# Patient Record
Sex: Male | Born: 1960
Health system: Southern US, Community
[De-identification: ages and names within clinical notes are randomized; demographics above are authoritative.]

## PROBLEM LIST (undated history)

## (undated) DIAGNOSIS — T148XXA Other injury of unspecified body region, initial encounter: Secondary | ICD-10-CM

## (undated) DIAGNOSIS — S31109A Unspecified open wound of abdominal wall, unspecified quadrant without penetration into peritoneal cavity, initial encounter: Secondary | ICD-10-CM

## (undated) DIAGNOSIS — K9289 Other specified diseases of the digestive system: Secondary | ICD-10-CM

## (undated) DIAGNOSIS — K5792 Diverticulitis of intestine, part unspecified, without perforation or abscess without bleeding: Secondary | ICD-10-CM

## (undated) HISTORY — PX: KNEE SURGERY: SHX244

## (undated) HISTORY — DX: Other injury of unspecified body region, initial encounter: T14.8XXA

## (undated) HISTORY — DX: Other specified diseases of the digestive system: K92.89

## (undated) HISTORY — PX: NASAL SINUS SURGERY: SHX719

## (undated) HISTORY — PX: TOE SURGERY: SHX1073

## (undated) HISTORY — PX: ABDOMINAL SURGERY: SHX537

## (undated) HISTORY — DX: Unspecified open wound of abdominal wall, unspecified quadrant without penetration into peritoneal cavity, initial encounter: S31.109A

---

## 2003-07-02 ENCOUNTER — Inpatient Hospital Stay (HOSPITAL_COMMUNITY): Admission: EM | Admit: 2003-07-02 | Discharge: 2003-07-07 | Payer: Self-pay | Admitting: Emergency Medicine

## 2003-12-10 ENCOUNTER — Encounter: Admission: RE | Admit: 2003-12-10 | Discharge: 2003-12-10 | Payer: Self-pay | Admitting: Family Medicine

## 2004-01-11 ENCOUNTER — Ambulatory Visit (HOSPITAL_COMMUNITY): Admission: RE | Admit: 2004-01-11 | Discharge: 2004-01-11 | Payer: Self-pay | Admitting: *Deleted

## 2004-01-11 ENCOUNTER — Encounter (INDEPENDENT_AMBULATORY_CARE_PROVIDER_SITE_OTHER): Payer: Self-pay | Admitting: *Deleted

## 2010-03-31 ENCOUNTER — Encounter
Admission: RE | Admit: 2010-03-31 | Discharge: 2010-03-31 | Payer: Self-pay | Source: Home / Self Care | Attending: Gastroenterology | Admitting: Gastroenterology

## 2010-05-06 ENCOUNTER — Other Ambulatory Visit (HOSPITAL_COMMUNITY): Payer: Self-pay | Admitting: General Surgery

## 2010-05-06 ENCOUNTER — Encounter (HOSPITAL_COMMUNITY)
Admission: RE | Admit: 2010-05-06 | Discharge: 2010-05-06 | Disposition: A | Discharge status: Home / Self Care | Payer: BC Managed Care – PPO | Source: Ambulatory Visit | Attending: General Surgery | Admitting: General Surgery

## 2010-05-06 ENCOUNTER — Ambulatory Visit (HOSPITAL_COMMUNITY)
Admission: RE | Admit: 2010-05-06 | Discharge: 2010-05-06 | Disposition: A | Discharge status: Home / Self Care | Payer: BC Managed Care – PPO | Source: Ambulatory Visit | Attending: General Surgery | Admitting: General Surgery

## 2010-05-06 DIAGNOSIS — K5732 Diverticulitis of large intestine without perforation or abscess without bleeding: Secondary | ICD-10-CM | POA: Insufficient documentation

## 2010-05-06 DIAGNOSIS — Z01818 Encounter for other preprocedural examination: Secondary | ICD-10-CM | POA: Insufficient documentation

## 2010-05-06 DIAGNOSIS — Z01812 Encounter for preprocedural laboratory examination: Secondary | ICD-10-CM | POA: Insufficient documentation

## 2010-05-06 DIAGNOSIS — Z01810 Encounter for preprocedural cardiovascular examination: Secondary | ICD-10-CM | POA: Insufficient documentation

## 2010-05-06 LAB — CBC
HCT: 40.5 % (ref 39.0–52.0)
Hemoglobin: 13.8 g/dL (ref 13.0–17.0)
MCHC: 34.1 g/dL (ref 30.0–36.0)
MCV: 89.2 fL (ref 78.0–100.0)
Platelets: 248 10*3/uL (ref 150–400)
RBC: 4.54 MIL/uL (ref 4.22–5.81)
RDW: 13.6 % (ref 11.5–15.5)
WBC: 6.4 10*3/uL (ref 4.0–10.5)

## 2010-05-06 LAB — BASIC METABOLIC PANEL
BUN: 13 mg/dL (ref 6–23)
CO2: 29 mEq/L (ref 19–32)
Calcium: 9.3 mg/dL (ref 8.4–10.5)
Chloride: 105 mEq/L (ref 96–112)
Creatinine, Ser: 0.86 mg/dL (ref 0.4–1.5)
GFR calc Af Amer: >60 mL/min (ref >60)
GFR calc non Af Amer: >60 mL/min (ref >60)
Glucose, Bld: 93 mg/dL (ref 70–99)
Potassium: 4.9 mEq/L (ref 3.5–5.1)
Sodium: 138 mEq/L (ref 135–145)

## 2010-05-06 LAB — DIFFERENTIAL
Basophils Absolute: 0 10*3/uL (ref 0.0–0.1)
Eosinophils Absolute: 0.5 10*3/uL (ref 0.0–0.7)
Eosinophils Relative: 7 % — ABNORMAL HIGH (ref 0–5)
Lymphocytes Relative: 35 % (ref 12–46)
Lymphs Abs: 2.2 10*3/uL (ref 0.7–4.0)
Monocytes Relative: 6 % (ref 3–12)
Neutro Abs: 3.3 10*3/uL (ref 1.7–7.7)

## 2010-05-06 LAB — SURGICAL PCR SCREEN
MRSA, PCR: NEGATIVE
Staphylococcus aureus: NEGATIVE

## 2010-05-08 ENCOUNTER — Other Ambulatory Visit: Payer: Self-pay | Admitting: General Surgery

## 2010-05-08 ENCOUNTER — Inpatient Hospital Stay (HOSPITAL_COMMUNITY)
Admission: RE | Admit: 2010-05-08 | Discharge: 2010-05-23 | DRG: 148 | Disposition: A | Discharge status: Home / Self Care | Payer: BC Managed Care – PPO | Source: Ambulatory Visit | Attending: General Surgery | Admitting: General Surgery

## 2010-05-08 DIAGNOSIS — K5732 Diverticulitis of large intestine without perforation or abscess without bleeding: Principal | ICD-10-CM | POA: Diagnosis present

## 2010-05-08 DIAGNOSIS — K661 Hemoperitoneum: Secondary | ICD-10-CM | POA: Diagnosis present

## 2010-05-08 DIAGNOSIS — R000 Tachycardia, unspecified: Secondary | ICD-10-CM | POA: Diagnosis present

## 2010-05-08 DIAGNOSIS — D62 Acute posthemorrhagic anemia: Secondary | ICD-10-CM | POA: Diagnosis not present

## 2010-05-08 DIAGNOSIS — K929 Disease of digestive system, unspecified: Secondary | ICD-10-CM | POA: Diagnosis present

## 2010-05-08 DIAGNOSIS — Y849 Medical procedure, unspecified as the cause of abnormal reaction of the patient, or of later complication, without mention of misadventure at the time of the procedure: Secondary | ICD-10-CM | POA: Diagnosis not present

## 2010-05-08 DIAGNOSIS — K560 Paralytic ileus: Secondary | ICD-10-CM | POA: Diagnosis not present

## 2010-05-08 LAB — ABO/RH: ABO/RH(D): A POS

## 2010-05-08 LAB — TYPE AND SCREEN
ABO/RH(D): A POS
Antibody Screen: NEGATIVE

## 2010-05-09 LAB — BASIC METABOLIC PANEL
BUN: 6 mg/dL (ref 6–23)
CO2: 28 mEq/L (ref 19–32)
Calcium: 8.9 mg/dL (ref 8.4–10.5)
Chloride: 102 mEq/L (ref 96–112)
Creatinine, Ser: 0.88 mg/dL (ref 0.4–1.5)
GFR calc Af Amer: >60 mL/min (ref >60)
GFR calc non Af Amer: >60 mL/min (ref >60)
Glucose, Bld: 126 mg/dL — ABNORMAL HIGH (ref 70–99)
Potassium: 4.3 mEq/L (ref 3.5–5.1)
Sodium: 136 mEq/L (ref 135–145)

## 2010-05-09 LAB — DIFFERENTIAL
Basophils Absolute: 0 10*3/uL (ref 0.0–0.1)
Basophils Relative: 0 % (ref 0–1)
Eosinophils Absolute: 0 10*3/uL (ref 0.0–0.7)
Eosinophils Relative: 0 % (ref 0–5)
Lymphocytes Relative: 13 % (ref 12–46)
Lymphs Abs: 1.7 10*3/uL (ref 0.7–4.0)
Monocytes Absolute: 0.8 10*3/uL (ref 0.1–1.0)
Monocytes Relative: 6 % (ref 3–12)
Neutro Abs: 10.5 10*3/uL — ABNORMAL HIGH (ref 1.7–7.7)
Neutrophils Relative %: 80 % — ABNORMAL HIGH (ref 43–77)

## 2010-05-09 LAB — CBC
HCT: 37.8 % — ABNORMAL LOW (ref 39.0–52.0)
Hemoglobin: 13.3 g/dL (ref 13.0–17.0)
MCH: 30.5 pg (ref 26.0–34.0)
MCHC: 35.2 g/dL (ref 30.0–36.0)
MCV: 86.7 fL (ref 78.0–100.0)
Platelets: 248 10*3/uL (ref 150–400)
RBC: 4.36 MIL/uL (ref 4.22–5.81)
RDW: 13 % (ref 11.5–15.5)
WBC: 13.1 10*3/uL — ABNORMAL HIGH (ref 4.0–10.5)

## 2010-05-10 LAB — HEMOGLOBIN AND HEMATOCRIT, BLOOD
HCT: 34.1 % — ABNORMAL LOW (ref 39.0–52.0)
Hemoglobin: 12.2 g/dL — ABNORMAL LOW (ref 13.0–17.0)

## 2010-05-11 LAB — DIFFERENTIAL
Basophils Absolute: 0 10*3/uL (ref 0.0–0.1)
Basophils Relative: 0 % (ref 0–1)
Eosinophils Absolute: 0.1 10*3/uL (ref 0.0–0.7)
Eosinophils Relative: 1 % (ref 0–5)
Lymphocytes Relative: 21 % (ref 12–46)
Lymphs Abs: 2.1 10*3/uL (ref 0.7–4.0)
Monocytes Absolute: 1 10*3/uL (ref 0.1–1.0)
Monocytes Relative: 9 % (ref 3–12)
Neutro Abs: 7 10*3/uL (ref 1.7–7.7)
Neutrophils Relative %: 68 % (ref 43–77)

## 2010-05-11 LAB — CBC
HCT: 22 % — ABNORMAL LOW (ref 39.0–52.0)
Hemoglobin: 8 g/dL — ABNORMAL LOW (ref 13.0–17.0)
MCH: 31.9 pg (ref 26.0–34.0)
MCHC: 36.4 g/dL — ABNORMAL HIGH (ref 30.0–36.0)
MCV: 87.6 fL (ref 78.0–100.0)
Platelets: 171 10*3/uL (ref 150–400)
RBC: 2.51 MIL/uL — ABNORMAL LOW (ref 4.22–5.81)
RDW: 13 % (ref 11.5–15.5)
WBC: 10.2 10*3/uL (ref 4.0–10.5)

## 2010-05-11 LAB — BASIC METABOLIC PANEL
BUN: 7 mg/dL (ref 6–23)
CO2: 28 mEq/L (ref 19–32)
Calcium: 8.6 mg/dL (ref 8.4–10.5)
Chloride: 102 mEq/L (ref 96–112)
Creatinine, Ser: 0.89 mg/dL (ref 0.4–1.5)
GFR calc Af Amer: >60 mL/min (ref >60)
GFR calc non Af Amer: >60 mL/min (ref >60)
Glucose, Bld: 118 mg/dL — ABNORMAL HIGH (ref 70–99)
Potassium: 4.4 mEq/L (ref 3.5–5.1)
Sodium: 135 mEq/L (ref 135–145)

## 2010-05-12 LAB — CBC
HCT: 21 % — ABNORMAL LOW (ref 39.0–52.0)
Hemoglobin: 7.5 g/dL — ABNORMAL LOW (ref 13.0–17.0)
MCH: 31.4 pg (ref 26.0–34.0)
MCHC: 35.7 g/dL (ref 30.0–36.0)
MCV: 87.9 fL (ref 78.0–100.0)
Platelets: 193 10*3/uL (ref 150–400)
RBC: 2.39 MIL/uL — ABNORMAL LOW (ref 4.22–5.81)
RDW: 12.9 % (ref 11.5–15.5)
WBC: 10.1 10*3/uL (ref 4.0–10.5)

## 2010-05-13 LAB — CBC
HCT: 20.8 % — ABNORMAL LOW (ref 39.0–52.0)
Hemoglobin: 7.4 g/dL — ABNORMAL LOW (ref 13.0–17.0)
MCH: 31.2 pg (ref 26.0–34.0)
MCHC: 35.6 g/dL (ref 30.0–36.0)
MCV: 87.8 fL (ref 78.0–100.0)
Platelets: 256 10*3/uL (ref 150–400)
RBC: 2.37 MIL/uL — ABNORMAL LOW (ref 4.22–5.81)
RDW: 12.8 % (ref 11.5–15.5)
WBC: 8.5 10*3/uL (ref 4.0–10.5)

## 2010-05-13 LAB — DIFFERENTIAL
Basophils Absolute: 0 10*3/uL (ref 0.0–0.1)
Basophils Relative: 0 % (ref 0–1)
Eosinophils Absolute: 0.2 10*3/uL (ref 0.0–0.7)
Eosinophils Relative: 3 % (ref 0–5)
Lymphocytes Relative: 15 % (ref 12–46)
Lymphs Abs: 1.3 10*3/uL (ref 0.7–4.0)
Monocytes Absolute: 0.9 10*3/uL (ref 0.1–1.0)
Monocytes Relative: 10 % (ref 3–12)
Neutro Abs: 6 10*3/uL (ref 1.7–7.7)
Neutrophils Relative %: 72 % (ref 43–77)

## 2010-05-14 ENCOUNTER — Inpatient Hospital Stay (HOSPITAL_COMMUNITY): Payer: BC Managed Care – PPO

## 2010-05-14 LAB — CBC
Hemoglobin: 7.9 g/dL — ABNORMAL LOW (ref 13.0–17.0)
MCH: 31.6 pg (ref 26.0–34.0)
MCHC: 36.1 g/dL — ABNORMAL HIGH (ref 30.0–36.0)
Platelets: 349 10*3/uL (ref 150–400)

## 2010-05-14 LAB — URINALYSIS, MICROSCOPIC ONLY
Bilirubin Urine: NEGATIVE
Ketones, ur: 15 mg/dL — AB
Protein, ur: 100 mg/dL — AB
Urobilinogen, UA: 0.2 mg/dL (ref 0.0–1.0)

## 2010-05-14 LAB — POCT I-STAT 4, (NA,K, GLUC, HGB,HCT)
Glucose, Bld: 125 mg/dL — ABNORMAL HIGH (ref 70–99)
HCT: 27 % — ABNORMAL LOW (ref 39.0–52.0)
Sodium: 136 mEq/L (ref 135–145)

## 2010-05-15 LAB — DIFFERENTIAL
Basophils Relative: 0 % (ref 0–1)
Eosinophils Absolute: 0 10*3/uL (ref 0.0–0.7)
Eosinophils Relative: 0 % (ref 0–5)
Monocytes Relative: 7 % (ref 3–12)
Neutrophils Relative %: 87 % — ABNORMAL HIGH (ref 43–77)

## 2010-05-15 LAB — CBC
MCH: 30.5 pg (ref 26.0–34.0)
Platelets: 398 10*3/uL (ref 150–400)
RBC: 2.72 MIL/uL — ABNORMAL LOW (ref 4.22–5.81)
RDW: 13.2 % (ref 11.5–15.5)

## 2010-05-15 LAB — BASIC METABOLIC PANEL
Calcium: 8.2 mg/dL — ABNORMAL LOW (ref 8.4–10.5)
Chloride: 100 mEq/L (ref 96–112)
Creatinine, Ser: 0.79 mg/dL (ref 0.4–1.5)
GFR calc Af Amer: >60 mL/min (ref >60)
GFR calc non Af Amer: >60 mL/min (ref >60)

## 2010-05-15 NOTE — Op Note (Signed)
NAME:  Michael Santiago, Michael Santiago             ACCOUNT NO.:  [id number]  MEDICAL RECORD NO.:  [id number]           PATIENT TYPE:  I  LOCATION:  5155                         FACILITY:  [facility]  PHYSICIAN:  Cherylynn Ridges, M.D.    DATE OF BIRTH:  November 16, 1960  DATE OF PROCEDURE:  05/08/2010 DATE OF DISCHARGE:                              OPERATIVE REPORT   PREOPERATIVE DIAGNOSIS:  Recurrent sigmoid diverticulitis.  POSTOPERATIVE DIAGNOSIS:  Recurrent sigmoid diverticulitis.  PROCEDURE:  Sigmoid colectomy.  SURGEON:  Marta Lamas. Lindie Spruce, MD  ASSISTANT:  Almond Lint, MD  ANESTHESIA:  General endotracheal.  ESTIMATED BLOOD LOSS:  Less than 50 mL.  COMPLICATIONS:  None.  CONDITION:  Stable.  FINDINGS:  Midsigmoid recurrent chronic inflammation from acute diverticulitis.  INDICATIONS FOR OPERATION:  The patient is a 50 year old who has had multiple attacks of diverticulitis who now comes in for an elective colectomy.  OPERATION:  The patient was taken to the operating room and placed on table in supine position.  After an adequate general endotracheal anesthetic was administered, he was prepped and draped in the usual sterile manner exposing the midline of the abdomen.  After proper time-out was performed identifying the patient and procedure to be performed, a midline incision was made from below the umbilicus down to the pubic crest.  We took it down to and through the midline fascia and then through the peritoneum taking care not to endanger bowel below.  A Balfour retractor was put in place.  The patient was placed in Trendelenburg position.  We could easily visualize and mobilize the mid sigmoid colon inflammatory mass that was sort of a ball of tissue.  We mobilized it and with proper retraction went through the mesentery below the level of the sigmoid mass.  We came across the upper portion of the colon using a GIA 75 stapler.  We mobilized the colon proximally, making sure to keep  down the ureter and other structures laterally which we did so.  We mobilized the line of Toldt.  We could easily see the left ureter and was out of the area of resection.  We came across more proximal descending colon just proximal to the sigmoid colon with a GIA 75 stapler.  We used an [employer] Jaw coagulator to come across the mesentery.  This freed up as specimen which was sent separately.  Once this was done, we did a hand-sewn single layer anastomosis using 2-0 silk between the proximal descending colon and the distal sigmoid colon. There was no tension on the anastomosis whatsoever.  A circumferential anastomosis was performed.  Once this was done, we changed surgeon's gloves, irrigated with saline solution, and closed.  We closed using a running looped #1 PDS suture.  The skin was closed using stainless steel staples.  We irrigated the subcu prior to closure. A sterile dressing was applied.  All needle counts, sponge counts, and instrument counts were correct.     Cherylynn Ridges, M.D.     JOW/MEDQ  D:  05/08/2010  T:  05/09/2010  Job:  [id number]  cc:   Anselmo Rod, MD, [facility]  Electronically Signed by Jimmye Norman M.D. on 05/14/2010 02:31:04 PM

## 2010-05-16 LAB — DIFFERENTIAL
Basophils Absolute: 0 10*3/uL (ref 0.0–0.1)
Basophils Relative: 0 % (ref 0–1)
Eosinophils Absolute: 0 10*3/uL (ref 0.0–0.7)
Eosinophils Relative: 0 % (ref 0–5)
Lymphocytes Relative: 7 % — ABNORMAL LOW (ref 12–46)
Lymphs Abs: 1.1 10*3/uL (ref 0.7–4.0)
Monocytes Absolute: 1.4 10*3/uL — ABNORMAL HIGH (ref 0.1–1.0)
Monocytes Relative: 9 % (ref 3–12)
Neutro Abs: 12.7 10*3/uL — ABNORMAL HIGH (ref 1.7–7.7)
Neutrophils Relative %: 83 % — ABNORMAL HIGH (ref 43–77)

## 2010-05-16 LAB — BASIC METABOLIC PANEL WITH GFR
BUN: 11 mg/dL (ref 6–23)
CO2: 26 meq/L (ref 19–32)
Calcium: 7.9 mg/dL — ABNORMAL LOW (ref 8.4–10.5)
Chloride: 103 meq/L (ref 96–112)
Creatinine, Ser: 0.79 mg/dL (ref 0.4–1.5)
GFR calc non Af Amer: >60 mL/min
Glucose, Bld: 132 mg/dL — ABNORMAL HIGH (ref 70–99)
Potassium: 4.1 meq/L (ref 3.5–5.1)
Sodium: 134 meq/L — ABNORMAL LOW (ref 135–145)

## 2010-05-16 LAB — CBC
Hemoglobin: 7.5 g/dL — ABNORMAL LOW (ref 13.0–17.0)
Platelets: 425 10*3/uL — ABNORMAL HIGH (ref 150–400)
RBC: 2.48 MIL/uL — ABNORMAL LOW (ref 4.22–5.81)
WBC: 15.2 10*3/uL — ABNORMAL HIGH (ref 4.0–10.5)

## 2010-05-18 ENCOUNTER — Inpatient Hospital Stay (HOSPITAL_COMMUNITY): Payer: BC Managed Care – PPO

## 2010-05-18 LAB — BASIC METABOLIC PANEL
CO2: 29 mEq/L (ref 19–32)
Calcium: 8.2 mg/dL — ABNORMAL LOW (ref 8.4–10.5)
Chloride: 105 mEq/L (ref 96–112)
GFR calc Af Amer: >60 mL/min (ref >60)
Glucose, Bld: 139 mg/dL — ABNORMAL HIGH (ref 70–99)
Sodium: 140 mEq/L (ref 135–145)

## 2010-05-18 LAB — CBC
HCT: 20.5 % — ABNORMAL LOW (ref 39.0–52.0)
Hemoglobin: 7.1 g/dL — ABNORMAL LOW (ref 13.0–17.0)
MCH: 30.3 pg (ref 26.0–34.0)
MCHC: 34.6 g/dL (ref 30.0–36.0)
RBC: 2.34 MIL/uL — ABNORMAL LOW (ref 4.22–5.81)

## 2010-05-19 LAB — CBC
Hemoglobin: 7.1 g/dL — ABNORMAL LOW (ref 13.0–17.0)
MCH: 30.5 pg (ref 26.0–34.0)
MCHC: 34.5 g/dL (ref 30.0–36.0)
MCV: 88.4 fL (ref 78.0–100.0)
RBC: 2.33 MIL/uL — ABNORMAL LOW (ref 4.22–5.81)

## 2010-05-19 LAB — BASIC METABOLIC PANEL
BUN: 9 mg/dL (ref 6–23)
CO2: 29 mEq/L (ref 19–32)
Calcium: 8.3 mg/dL — ABNORMAL LOW (ref 8.4–10.5)
Chloride: 107 mEq/L (ref 96–112)
Creatinine, Ser: 0.75 mg/dL (ref 0.4–1.5)
GFR calc Af Amer: >60 mL/min (ref >60)
Glucose, Bld: 111 mg/dL — ABNORMAL HIGH (ref 70–99)

## 2010-05-21 LAB — CULTURE, BLOOD (ROUTINE X 2): Culture  Setup Time: 201203081014

## 2010-05-22 LAB — DIFFERENTIAL
Basophils Relative: 0 % (ref 0–1)
Eosinophils Absolute: 0.2 10*3/uL (ref 0.0–0.7)
Eosinophils Relative: 2 % (ref 0–5)
Lymphs Abs: 2.1 10*3/uL (ref 0.7–4.0)
Monocytes Absolute: 0.8 10*3/uL (ref 0.1–1.0)
Neutro Abs: 8.4 10*3/uL — ABNORMAL HIGH (ref 1.7–7.7)

## 2010-05-22 LAB — CBC
HCT: 23 % — ABNORMAL LOW (ref 39.0–52.0)
Hemoglobin: 7.6 g/dL — ABNORMAL LOW (ref 13.0–17.0)
MCH: 29.6 pg (ref 26.0–34.0)
MCHC: 33 g/dL (ref 30.0–36.0)
MCV: 89.5 fL (ref 78.0–100.0)
RDW: 14.9 % (ref 11.5–15.5)

## 2010-05-24 NOTE — Discharge Summary (Signed)
NAME:  Michael Santiago, Michael Santiago             ACCOUNT NO.:  [id number]  MEDICAL RECORD NO.:  [id number]           PATIENT TYPE:  I  LOCATION:  5149                         FACILITY:  [facility]  PHYSICIAN:  Cherylynn Ridges, M.D.    DATE OF BIRTH:  Jul 09, 1960  DATE OF ADMISSION:  05/08/2010 DATE OF DISCHARGE:  05/23/2010                              DISCHARGE SUMMARY   DISCHARGE DIAGNOSES: 1. Sigmoid diverticulitis status post sigmoid colectomy with primary     anastomosis. 2. Postoperative anastomotic leak with resultant peritonitis and need     for colectomy and colostomy. 3. Postoperative anemia secondary to blood loss from surgery.  SURGEON ON BOTH OCCASIONS:  Cherylynn Ridges, MD  ANESTHESIA:  General endotracheal.  DISCHARGE MEDICATIONS:  He will be discharged home on his preoperative medication in addition to iron, vitamins, and also tramadol p.r.n. for pain.  He is to return to see me on Tuesday, May 27, 2010, for staple removal and evaluation of his wound.  His diet on discharge should be low residue if he has high ostomy output.  His condition should be stable.  He will return to see me next week.  BRIEF SUMMARY OF HOSPITAL COURSE:  The patient was admitted electively after bowel prep which was moderate bowel prep on May 08, 2010, for an elective sigmoid colectomy with anastomosis.  This was done with minimal problems and he had a hand-sewn single-layer anastomosis with 2-0 silk. There were no apparent problems with anastomosis at the time of surgery. There was good blood supply.  On postop day #1 and #2, he did well, however, on the evening of postop day #3 became tachycardic and repeat hemoglobin showed a drop in his hemoglobin from 12-8.4.  it was apparently that he had bleeding, however, he had been started on Lovenox and Toradol.  This seemed to exacerbate the problem.  He had no increasing abdominal pain, however, he did not feel very well on postop day #3.  On postop day #4,  he started to improve.  He was on clear liquid diet and Entereg postoperatively.  On postop day #5, he continued to do well.  On postop day #6, he was done well that morning, however, that evening when I went by to see him he had diaphoresis, tachycardia, and mild increased abdominal pain, but a chest x-ray showed significant amount of free air.  He was taken back to surgery where the anterior portion of his anastomosis had come apart.  He had a Hartmann procedure with minimal resection of additional colon, but left side colostomy was performed.  Postop since then, he has been doing quite well.  He is on postop day #9 now from the second procedure and he is doing well.  Stoma is healing well without evidence of infection.  His midline wound was left partially open and it is healing well.  No infection.  His last white count was 11.5 which was not increased.  His hemoglobin was up a bit at 7.3.  He will continue to take iron and vitamins at home.  I will see him again on Tuesday to reevaluate his wound  and his ostomy function.     Cherylynn Ridges, M.D.     JOW/MEDQ  D:  05/23/2010  T:  05/24/2010  Job:  [id number]  Electronically Signed by Jimmye Norman M.D. on 05/24/2010 02:48:34 PM

## 2010-05-24 NOTE — Op Note (Signed)
NAME:  Michael Santiago, TULLOCH             ACCOUNT NO.:  [id number]  MEDICAL RECORD NO.:  [id number]           PATIENT TYPE:  I  LOCATION:  5156                         FACILITY:  [facility]  PHYSICIAN:  Cherylynn Ridges, M.D.    DATE OF BIRTH:  April 13, 1960  DATE OF PROCEDURE:  05/14/2010 DATE OF DISCHARGE:                              OPERATIVE REPORT   PREOPERATIVE DIAGNOSIS:  Pneumoperitoneum with peritonitis.  POSTOPERATIVE DIAGNOSIS:  Sigmoid colon anastomotic leak with hemoperitoneum.  PROCEDURE:  Colectomy and colostomy and Hartmann pouch.  SURGEON:  Cherylynn Ridges, MD  ASSISTANT:  Almond Lint, MD  ANESTHESIA:  General endotracheal.  ESTIMATED BLOOD LOSS:  Acutely 100 mL.  COMPLICATIONS:  Hemoperitoneum.  CONDITION:  Fair.  INDICATIONS FOR OPERATION:  The patient is a 50 year old who had recently undergone an open sigmoid colectomy and primary anastomosis for sigmoid diverticulitis.  On postop day #6 today, the patient developed acute tachycardia and diaphoresis.  Chest x-ray demonstrated pneumoperitoneum.  He had increased abdominal pain, although he had several bowel movements.  He was taken to the operating room for exploration.  FINDINGS:  The anterior wall of the previously hand-sewn single layer anastomosis had partially come apart.  We completed that and brought out a colostomy.  OPERATION:  The patient was taken to the operating room and placed on table in supine position.  After an adequate general endotracheal anesthetic was administered, a proper time-out was performed identifying the patient and procedure to be performed.  He was prepped and draped in the usual sterile manner.  The patient's previous staples were taken out.  We then cut the fascial stitch with looped #1 PDS.  Upon entering the peritoneal cavity, some foul-smelling bloody fluid was aspirated.  We used a Engineer, manufacturing in order to gain adequate exposure.  We did not have to enlarge  the incision.  We irrigated out the pelvis where there was a large clotted hemoperitoneum which we subsequently removed.  We theorized that the patient bled postoperatively from Lovenox injections and also Toradol that he had received for pain.  Perhaps, he developed hematoma after anastomosis as the anterior wall was partially detached.  We completed the detachment by just cutting across the distal portion of the anastomosis with a TA55 stapler closing off the Hartmann pouch.  We then brought out the proximal portion of the previous anastomosis in the left upper quadrant for colostomy, that was matured with 3-0 Vicryl and after that, fascia was closed.  A good portion of the procedure was spent washing out a significant hemoperitoneum mostly which was in the pelvis and involved clots in loops of bowel.  We irrigated out thoroughly and once this was done, we changed our gloves.  Once we changed our gloves, we closed the fascia using looped #1 PDS. We then closed the skin partially using interrupted staples with wicks of Telfa in between the staple.  We matured the colostomy and placed a bag.  All counts were correct.     Cherylynn Ridges, M.D.     JOW/MEDQ  D:  05/14/2010  T:  05/15/2010  Job:  [id number]  Electronically  Signed by Jimmye Norman M.D. on 05/24/2010 02:48:27 PM

## 2010-05-29 ENCOUNTER — Inpatient Hospital Stay (HOSPITAL_COMMUNITY)
Admission: EM | Admit: 2010-05-29 | Discharge: 2010-05-31 | DRG: 189 | Disposition: A | Discharge status: Home Health | Payer: BC Managed Care – PPO | Source: Ambulatory Visit | Attending: General Surgery | Admitting: General Surgery

## 2010-05-29 ENCOUNTER — Emergency Department (HOSPITAL_COMMUNITY): Payer: BC Managed Care – PPO

## 2010-05-29 DIAGNOSIS — Z9889 Other specified postprocedural states: Secondary | ICD-10-CM

## 2010-05-29 DIAGNOSIS — K651 Peritoneal abscess: Principal | ICD-10-CM | POA: Diagnosis present

## 2010-05-29 LAB — DIFFERENTIAL
Basophils Absolute: 0 10*3/uL (ref 0.0–0.1)
Basophils Relative: 0 % (ref 0–1)
Eosinophils Absolute: 0 10*3/uL (ref 0.0–0.7)
Eosinophils Relative: 0 % (ref 0–5)
Lymphocytes Relative: 14 % (ref 12–46)
Lymphs Abs: 1.4 10*3/uL (ref 0.7–4.0)
Monocytes Absolute: 0.7 10*3/uL (ref 0.1–1.0)
Monocytes Relative: 7 % (ref 3–12)
Neutro Abs: 7.8 10*3/uL — ABNORMAL HIGH (ref 1.7–7.7)
Neutrophils Relative %: 79 % — ABNORMAL HIGH (ref 43–77)

## 2010-05-29 LAB — BASIC METABOLIC PANEL
BUN: 13 mg/dL (ref 6–23)
CO2: 28 mEq/L (ref 19–32)
Calcium: 8.7 mg/dL (ref 8.4–10.5)
Chloride: 98 mEq/L (ref 96–112)
Creatinine, Ser: 0.81 mg/dL (ref 0.4–1.5)
GFR calc Af Amer: >60 mL/min (ref >60)
GFR calc non Af Amer: >60 mL/min (ref >60)
Glucose, Bld: 112 mg/dL — ABNORMAL HIGH (ref 70–99)
Potassium: 3.5 mEq/L (ref 3.5–5.1)
Sodium: 133 mEq/L — ABNORMAL LOW (ref 135–145)

## 2010-05-29 LAB — CBC
Hemoglobin: 8.6 g/dL — ABNORMAL LOW (ref 13.0–17.0)
MCH: 29.5 pg (ref 26.0–34.0)
MCHC: 34.1 g/dL (ref 30.0–36.0)
RDW: 15.1 % (ref 11.5–15.5)

## 2010-05-30 ENCOUNTER — Inpatient Hospital Stay (HOSPITAL_COMMUNITY): Payer: BC Managed Care – PPO

## 2010-05-30 LAB — PROTIME-INR: INR: 1.08 (ref 0.00–1.49)

## 2010-05-30 MED ORDER — IOHEXOL 300 MG/ML  SOLN
100.0000 mL | Freq: Once | INTRAMUSCULAR | Status: AC | PRN
  Administered 2010-05-30: 130 mL via INTRAVENOUS

## 2010-05-31 LAB — BASIC METABOLIC PANEL
BUN: 5 mg/dL — ABNORMAL LOW (ref 6–23)
CO2: 27 mEq/L (ref 19–32)
GFR calc non Af Amer: >60 mL/min (ref >60)
Glucose, Bld: 97 mg/dL (ref 70–99)
Potassium: 3.7 mEq/L (ref 3.5–5.1)

## 2010-05-31 LAB — CBC
HCT: 29.6 % — ABNORMAL LOW (ref 39.0–52.0)
Hemoglobin: 9.6 g/dL — ABNORMAL LOW (ref 13.0–17.0)
MCV: 88.1 fL (ref 78.0–100.0)
RDW: 15.4 % (ref 11.5–15.5)
WBC: 5.5 10*3/uL (ref 4.0–10.5)

## 2010-06-01 LAB — CULTURE, ROUTINE-ABSCESS

## 2010-06-04 NOTE — H&P (Signed)
  NAME:  Michael Santiago, Michael Santiago NO.:  [id number]  MEDICAL RECORD NO.:  [id number]           PATIENT TYPE:  I  LOCATION:  5152                         FACILITY:  [facility]  PHYSICIAN:  Juanetta Gosling, MDDATE OF BIRTH:  1960/10/19  DATE OF ADMISSION:  05/29/2010 DATE OF DISCHARGE:                             HISTORY & PHYSICAL   CHIEF COMPLAINTS:  Draining from wound.  HISTORY OF PRESENT ILLNESS:  This is a 50 year old male who is status post a sigmoid colectomy for recurrent diverticulitis on May 09, 2010. This was complicated by an anastomotic leak leading to a colostomy and then a Hartmann pouch.  He was discharged home on March 17 by Dr. Lindie Spruce and was seen back on Tuesday doing well.  He has been having bowel movement through his colostomy.  No nausea or vomiting, tolerating his diet.  Today while he was doing some work in his garage, he had some purulent drainage from his midline wound that was fairly copious in amount and brought into the emergency room.  Denies any fevers.  PAST MEDICAL HISTORY:  Recurrent diverticular disease.  PAST SURGICAL HISTORY:  As above plus a right knee surgery and sinus surgery.  FAMILY HISTORY:  Noncontributory.  SOCIAL HISTORY:  No alcohol or tobacco.  DRUG ALLERGIES:  None known.  He does state that when he was here last time, he had some reaction with combination of antibiotics and morphine, but is unsure what that is today.  MEDICATIONS:  Multivitamin and iron.  REVIEW OF SYSTEMS:  Otherwise, negative.  PHYSICAL EXAMINATION:  VITAL SIGNS:  Temperature 98.7, pulse 120, blood pressure 127/72, and oxygen saturation 99%. GENERAL:  He is a well-appearing male in no distress. NECK:  Supple. HEART:  Tachycardic, regular rhythm. LUNGS:  Clear bilaterally. ABDOMEN:  Soft.  He has a couple of several pinholes, purulent drainage from his midline wound.  There is not a fluctuant area that I can identify.  His colostomy is pink  and functioning well.  LABORATORY EVALUATION:  Sodium 133, BUN 13, creatinine 0.81, glucose 112.  White blood cell count 9.9, hematocrit 25.2, platelets 546 with 79 segs.  A CT scan done tonight after I evaluated him shows a central abscess that appears to be the source of his midline abdominal wound leakage.  IMPRESSION:  Intra-abdominal abscess.  PLAN OF ACTION:  Admission, n.p.o.  We will consult IR for drainage in the morning and begin him on antibiotics.  We discussed the source of what happened and the likelihood that this hopefully will just be able to get drained and he will be able to return home in the near future.     Juanetta Gosling, MD     MCW/MEDQ  D:  05/30/2010  T:  05/30/2010  Job:  [id number]  Electronically Signed by Emelia Loron MD on 06/04/2010 12:26:47 PM

## 2010-07-15 ENCOUNTER — Encounter (INDEPENDENT_AMBULATORY_CARE_PROVIDER_SITE_OTHER): Payer: Self-pay | Admitting: General Surgery

## 2010-07-25 NOTE — Discharge Summary (Signed)
NAME:  Michael Santiago, Michael Santiago                       ACCOUNT NO.:  [id number]   MEDICAL RECORD NO.:  [id number]                   PATIENT TYPE:  INP   LOCATION:  4734                                 FACILITY:  [facility]   PHYSICIAN:  Deirdre Peer. Polite, M.D.              DATE OF BIRTH:  1960-11-03   DATE OF ADMISSION:  07/02/2003  DATE OF DISCHARGE:  07/07/2003                                 DISCHARGE SUMMARY   DISCHARGE DIAGNOSES:  1. Diverticulitis, improved at discharge. CAT scan with evidence of sigmoid     diverticulitis without evidence of discreet abscess.  2. Nausea and vomiting related to #1.  3. History of diverticulitis in the past.   DISCHARGE MEDICATIONS:  1. Percocet 1-2 tabs q.4-6h. p.r.n. pain.  2. Cipro 500 mg one every 12 hours.  3. Flagyl 500 mg one every 8 hours.  4. Phenergan 25 mg one every 4-6 hours p.r.n. nausea and vomiting.   DISPOSITION:  The patient discharged to home in stable condition. Asked to  follow up with primary M.D. in approximately one week. The patient is clear  to return to work Jul 10, 2003.   CONSULTATIONS:  None.   PROCEDURES PERFORMED:  CT of the abdomen and pelvis showed sigmoid  diverticulitis without evidence of discreet abscess. Follow-up abdominal  series within normal limits.   CBC on admission white count 13.3, hemoglobin 14.6, platelets 250. BMET on  admission within normal limits. UA cloudy, specific gravity 1024, greater  than 80 ketones. At discharge white count 6.2, hemoglobin 4.9, BMET within  normal limits. PSA 0.7.   Follow-up x-ray shows gas and contrast from recent CT noted throughout the  colon. No evidence of bowel obstruction or pneumoperitoneum. Prostate  calcifications are present.   HISTORY OF PRESENT ILLNESS:  Michael Santiago is a 50 year old male with a  history of diverticulosis who presents to the ED with left lower quadrant  pain, which was preceded by nausea and vomiting x4 days. In the ED the  patient was  evaluated and had subjective fever, leukocytosis on CBC, CAT  scan showing evidence of sigmoid diverticulitis. Admission was deemed  necessary for further evaluation and treatment.   PAST MEDICAL HISTORY:  As stated above, history of diverticulosis.   MEDICATIONS:  None.   ALLERGIES:  No known drug allergies.   SOCIAL HISTORY:  Denies tobacco or drugs. The patient drinks beer,  approximately one or two beers a day.   FAMILY HISTORY:  Noncontributory.   REVIEW OF SYSTEMS:  As stated in the HPI.   HOSPITAL COURSE:  The patient was admitted to [facility] for  evaluation and treatment of sigmoid diverticulitis. The patient was treated  with empiric antibiotics, Cipro and Flagyl and was made n.p.o. except ice  chips and given judicious IV fluids. The patient's hospital course was one  of slow improvement. Each time a feeding trial was attempted the patient  complained of  abdominal discomfort and recurrent nausea and vomiting. Follow-  up x-rays did not reveal any acute changes. On April 29 the patient's  symptoms seemed to improve and he was able to tolerate clears. On April 30  the patient was able to tolerate solids without emesis. At this time the  patient is afebrile without leukocytosis and has been tolerating his p.o.  antibiotics x48 hours. The patient is felt stable for discharge. The patient  was asked to increase roughage in his diet. The patient is asked to follow  up with his primary doctor in approximately one week. The patient will be  provided antibiotics, analgesics, and nausea medications.                                                Deirdre Peer. Polite, M.D.    RDP/MEDQ  D:  07/07/2003  T:  07/07/2003  Job:  [id number]   cc:   Lonia Chimera ????, Dr/NP  [facility]

## 2010-08-26 ENCOUNTER — Other Ambulatory Visit (INDEPENDENT_AMBULATORY_CARE_PROVIDER_SITE_OTHER): Payer: Self-pay | Admitting: General Surgery

## 2010-08-26 DIAGNOSIS — Z9049 Acquired absence of other specified parts of digestive tract: Secondary | ICD-10-CM

## 2010-08-26 NOTE — Discharge Summary (Signed)
  NAME:  Michael Santiago, Michael Santiago             ACCOUNT NO.:  [id number]  MEDICAL RECORD NO.:  [id number]           PATIENT TYPE:  I  LOCATION:  5152                         FACILITY:  [facility]  PHYSICIAN:  Cherylynn Ridges, M.D.    DATE OF BIRTH:  [birth year]  DATE OF ADMISSION:  05/29/2010 DATE OF DISCHARGE:  05/31/2010                              DISCHARGE SUMMARY   DISCHARGE DIAGNOSIS:  Abdominal pain associated with possible intra- abdominal abscess.  PRINCIPAL PROCEDURE:  Repeat CAT scan.  He was discharged home on ciprofloxacin 500 mg p.o. b.i.d.  DIET:  Regular.  CONDITION:  Stable.  BRIEF SUMMARY OF HOSPITAL COURSE:  The patient is a 50 year old gentleman who had undergone a colectomy with primary anastomosis with subsequent anastomotic leak followed by colectomy and colostomy.  He was discharged home, but got readmitted with abdominal pain and a CT scan demonstrating what was thought to be a fluid collection that was drainable.  Upon repeat CAT scan in order to perform a drainage, the full of fluid collection had markedly diminished in size probably because it drained out of his wound which subsequently was treated locally.  He was started on ciprofloxacin and then was discharged home. He is return to see Dr. Lindie Spruce after end of the week following his discharge.     Cherylynn Ridges, M.D.     JOW/MEDQ  D:  08/06/2010  T:  08/07/2010  Job:  [id number]  Electronically Signed by Jimmye Norman M.D. on 08/26/2010 07:58:03 AM

## 2010-09-02 ENCOUNTER — Ambulatory Visit (HOSPITAL_COMMUNITY)
Admission: RE | Admit: 2010-09-02 | Discharge: 2010-09-02 | Disposition: A | Discharge status: Home / Self Care | Payer: BC Managed Care – PPO | Source: Ambulatory Visit | Attending: General Surgery | Admitting: General Surgery

## 2010-09-02 DIAGNOSIS — Z09 Encounter for follow-up examination after completed treatment for conditions other than malignant neoplasm: Secondary | ICD-10-CM | POA: Insufficient documentation

## 2010-09-02 DIAGNOSIS — Z9049 Acquired absence of other specified parts of digestive tract: Secondary | ICD-10-CM

## 2010-09-02 DIAGNOSIS — N4289 Other specified disorders of prostate: Secondary | ICD-10-CM | POA: Insufficient documentation

## 2010-09-02 MED ORDER — IOHEXOL 300 MG/ML  SOLN: 150.0000 mL | Freq: Once | INTRAMUSCULAR | Status: AC | PRN

## 2010-09-09 ENCOUNTER — Ambulatory Visit (INDEPENDENT_AMBULATORY_CARE_PROVIDER_SITE_OTHER): Payer: BC Managed Care – PPO | Admitting: General Surgery

## 2010-09-09 ENCOUNTER — Encounter (INDEPENDENT_AMBULATORY_CARE_PROVIDER_SITE_OTHER): Payer: Self-pay | Admitting: General Surgery

## 2010-09-09 DIAGNOSIS — Z933 Colostomy status: Secondary | ICD-10-CM

## 2010-09-09 NOTE — Progress Notes (Signed)
HPI Mr. Michael Santiago comes in after his diagnostic Gastrografin enema. This shows that he has about an 8--9 inch distal stump. His current stoma is functioning well.  PE His wound is healed well with no evidence of infection. His colostomy is functioning well  Studiy review I reviewed the Gastrografin enema with the patient. He understands the operation was necessary to reconnect his bowels  Assessment Functioning colostomy for upcoming takedown colostomy.  Plan Scheduled for surgery after bowel prep and preoperative antibiotics. Surgery has to be done [facility].

## 2010-09-24 ENCOUNTER — Encounter (HOSPITAL_COMMUNITY): Payer: BC Managed Care – PPO

## 2010-09-24 ENCOUNTER — Other Ambulatory Visit (INDEPENDENT_AMBULATORY_CARE_PROVIDER_SITE_OTHER): Payer: Self-pay | Admitting: General Surgery

## 2010-09-24 ENCOUNTER — Ambulatory Visit (HOSPITAL_COMMUNITY)
Admission: RE | Admit: 2010-09-24 | Discharge: 2010-09-24 | Disposition: A | Discharge status: Home / Self Care | Payer: BC Managed Care – PPO | Source: Ambulatory Visit | Attending: General Surgery | Admitting: General Surgery

## 2010-09-24 DIAGNOSIS — Z933 Colostomy status: Secondary | ICD-10-CM | POA: Insufficient documentation

## 2010-09-24 DIAGNOSIS — Z01812 Encounter for preprocedural laboratory examination: Secondary | ICD-10-CM | POA: Insufficient documentation

## 2010-09-24 DIAGNOSIS — Z01818 Encounter for other preprocedural examination: Secondary | ICD-10-CM

## 2010-09-24 LAB — DIFFERENTIAL
Basophils Absolute: 0 10*3/uL (ref 0.0–0.1)
Basophils Relative: 0 % (ref 0–1)
Lymphocytes Relative: 38 % (ref 12–46)
Monocytes Absolute: 0.5 10*3/uL (ref 0.1–1.0)
Neutro Abs: 4.2 10*3/uL (ref 1.7–7.7)
Neutrophils Relative %: 52 % (ref 43–77)

## 2010-09-24 LAB — CBC
HCT: 41.1 % (ref 39.0–52.0)
Hemoglobin: 13.8 g/dL (ref 13.0–17.0)
RBC: 4.85 MIL/uL (ref 4.22–5.81)
WBC: 8 10*3/uL (ref 4.0–10.5)

## 2010-09-24 LAB — BASIC METABOLIC PANEL
BUN: 20 mg/dL (ref 6–23)
CO2: 26 mEq/L (ref 19–32)
Chloride: 104 mEq/L (ref 96–112)
Glucose, Bld: 85 mg/dL (ref 70–99)
Potassium: 4 mEq/L (ref 3.5–5.1)
Sodium: 140 mEq/L (ref 135–145)

## 2010-09-24 LAB — SURGICAL PCR SCREEN: Staphylococcus aureus: NEGATIVE

## 2010-10-03 ENCOUNTER — Inpatient Hospital Stay (HOSPITAL_COMMUNITY)
Admission: RE | Admit: 2010-10-03 | Discharge: 2010-10-08 | DRG: 148 | Disposition: A | Discharge status: Home / Self Care | Payer: BC Managed Care – PPO | Source: Ambulatory Visit | Attending: General Surgery | Admitting: General Surgery

## 2010-10-03 ENCOUNTER — Other Ambulatory Visit (INDEPENDENT_AMBULATORY_CARE_PROVIDER_SITE_OTHER): Payer: Self-pay | Admitting: General Surgery

## 2010-10-03 DIAGNOSIS — Z433 Encounter for attention to colostomy: Secondary | ICD-10-CM

## 2010-10-03 DIAGNOSIS — Z01812 Encounter for preprocedural laboratory examination: Secondary | ICD-10-CM

## 2010-10-03 DIAGNOSIS — D62 Acute posthemorrhagic anemia: Secondary | ICD-10-CM | POA: Diagnosis not present

## 2010-10-03 DIAGNOSIS — Z9049 Acquired absence of other specified parts of digestive tract: Secondary | ICD-10-CM

## 2010-10-03 LAB — TYPE AND SCREEN
ABO/RH(D): A POS
Antibody Screen: NEGATIVE

## 2010-10-03 LAB — ABO/RH: ABO/RH(D): A POS

## 2010-10-03 LAB — HEMOGLOBIN AND HEMATOCRIT, BLOOD: Hemoglobin: 12.9 g/dL — ABNORMAL LOW (ref 13.0–17.0)

## 2010-10-04 LAB — BASIC METABOLIC PANEL
BUN: 7 mg/dL (ref 6–23)
Calcium: 9.1 mg/dL (ref 8.4–10.5)
GFR calc Af Amer: >60 mL/min (ref >60)
GFR calc non Af Amer: >60 mL/min (ref >60)
Glucose, Bld: 145 mg/dL — ABNORMAL HIGH (ref 70–99)
Potassium: 4.2 mEq/L (ref 3.5–5.1)

## 2010-10-04 LAB — DIFFERENTIAL
Basophils Absolute: 0 10*3/uL (ref 0.0–0.1)
Basophils Relative: 0 % (ref 0–1)
Eosinophils Relative: 0 % (ref 0–5)
Monocytes Absolute: 0.7 10*3/uL (ref 0.1–1.0)
Monocytes Relative: 7 % (ref 3–12)
Neutro Abs: 8.3 10*3/uL — ABNORMAL HIGH (ref 1.7–7.7)

## 2010-10-04 LAB — CBC
HCT: 33.9 % — ABNORMAL LOW (ref 39.0–52.0)
Hemoglobin: 12 g/dL — ABNORMAL LOW (ref 13.0–17.0)
MCH: 29.3 pg (ref 26.0–34.0)
MCHC: 35.4 g/dL (ref 30.0–36.0)
RDW: 15 % (ref 11.5–15.5)

## 2010-10-06 LAB — CREATININE, SERUM
Creatinine, Ser: 0.76 mg/dL (ref 0.50–1.35)
GFR calc non Af Amer: >60 mL/min (ref >60)

## 2010-10-06 LAB — CBC
Hemoglobin: 8.4 g/dL — ABNORMAL LOW (ref 13.0–17.0)
MCHC: 34.6 g/dL (ref 30.0–36.0)
Platelets: 167 10*3/uL (ref 150–400)
RBC: 2.88 MIL/uL — ABNORMAL LOW (ref 4.22–5.81)

## 2010-10-06 NOTE — Op Note (Signed)
Michael Santiago, Michael Santiago NO.:  [id number]  MEDICAL RECORD NO.:  [id number]  LOCATION:  1535                         FACILITY:  [facility]  PHYSICIAN:  Cherylynn Ridges, M.D.    DATE OF BIRTH:  06-03-1960  DATE OF PROCEDURE:  10/03/2010 DATE OF DISCHARGE:                              OPERATIVE REPORT   PREOPERATIVE DIAGNOSIS:  Functional colostomy, status post end colostomy for abdominal sepsis.  POSTOPERATIVE DIAGNOSIS:  Functional colostomy, status post end colostomy for abdominal sepsis.  PROCEDURE:  Takedown of colostomy.  SURGEON:  Cherylynn Ridges, M.D.  ASSISTANT:  Dr. Daphine Deutscher.  ANESTHESIA:  General endotracheal.  ESTIMATED BLOOD LOSS:  Less than 50 mL.  COMPLICATIONS:  No complications.  CONDITION:  Stable.  FINDINGS:  The patient had a lot of retained mucus and old blood and barium in his rectal stump which was washed out preoperatively through sigmoidoscope.  There was no tension on the anastomosis and all endsappeared to be viable.  There was no leak of the anastomosis, of air, Betadine at the end of the case.  INDICATIONS FOR OPERATION:  The patient is a 50 year old gentleman who had undergone end colostomy for a leak from a colectomy.  He now comes in for takedown.  OPERATION IN DETAIL:  The patient was taken to the operating room, placed on table in supine position.  After an adequate endotracheal anesthetic was administered, a proper time-out was performed, identifying the patient and procedure to be done, then we sutured the colostomy site using a running locking stitch of old silk.  We placed the patient in lithotomy.  We initially did a sigmoidoscope using a rigid scope and got up to about 12-15 cm.  It was done easily.  We washed out with Betadine, removing some old clot and also barium plugs.  Once this was completed, he was prepped and draped in usual sterile manner, exposing the abdomen and also the anal and perirectal area.  The  surgeon gown and glove in usual sterile manner, then we made a midline incision using #10 blade, down to the peritoneum.  Once this was completed, we were able to dissect out the peritoneal cavity, exposing up to the colostomy site.  Into the pelvis, we were able to dissect the small bowel adhesions away from the rectosigmoid stump which was easily found in the pelvis and marked with a Prolene suture.  We mobilized this taking off what was probably superior hemorrhoidal vessel ligating with 2-0 silk.  We then took off the end of the rectosigmoid stump using a contour blue stapler.  This left a nice fresh staple line which we subsequently used in order to perform our anastomosis.  We then took down the colostomy from the left abdominal wall using a 15- blade to encircle it initially and then electrocautery to dissected off from the surrounding subcutaneous tissue into the peritoneal cavity.  We then mobilized the colon proximally, taking down from the line of Toldt, above the finding for adequate laxity for the anastomosis.  We used a pursestring device in order to calm approximately 1.5 to 2 cm distal to the end stomal site.  We used a Mellody Dance needle with 3-0  Prolene in order to perform the pursestring.  Subsequently, resected the end stoma using sizers, used a 29-mm sizer into the proximal stoma. Therefore, used a 29- premium EEA in order to perform the anastomosis.  The anvil from the EEA was passed into the proximal colon and secured in place with 3-0 Prolene.  Once this was done, we had adequate mobilization of the rectal stump, the assistant went down to that and dilated the anorectal area with the 29-mm dilator and subsequently passed the stapling device through the anus up to the staple line.  As we centered it, the anvil and the puncturing anvil attachment came through just anterior to the staple line.  We attached it to the anvil and the proximal end of the colon, then closed the  stapling device.  We fired it.  Subsequent investigation of the dullness demonstrated intact proximal doughnut and then the distal doughnut detached itself at the staple line into 2 halves.  We subsequently tested the anastomosis for any leak with Betadine and with air.  With the pelvis full saline and clamping proximal to the staple line with spring clamp.  There was no bubbling of air whatsoever. Also there was no leakage of Betadine noted on the insufflation with Betadine.  Once the testing was completed, the surgeon and the assistant changed gown and gloves.  We subsequently irrigated with saline solution.  The colostomy site was closed at the anterior fascia using interrupted old Novafil sutures done in a figure-of-eight manner.  The posterior peritoneum was closed using running 2-0 Vicryl.  The fascia was closed in the midline using a running looped #1 PDS suture.  The skin was closed using stainless steel staples.  All counts were correct.  The Betadine ointment, 4x4 gauze were used to close the wound.     Cherylynn Ridges, M.D.     JOW/MEDQ  D:  10/03/2010  T:  10/03/2010  Job:  [id number]  Electronically Signed by Jimmye Norman M.D. on 10/06/2010 03:33:21 PM

## 2010-10-08 NOTE — Discharge Summary (Signed)
Michael Santiago, Michael Santiago NO.:  [id number]  MEDICAL RECORD NO.:  [id number]  LOCATION:  1535                         FACILITY:  [facility]  PHYSICIAN:  Cherylynn Ridges, M.D.    DATE OF BIRTH:  11-Dec-1960  DATE OF ADMISSION:  10/03/2010 DATE OF DISCHARGE:  10/08/2010                              DISCHARGE SUMMARY   DISCHARGE DIAGNOSIS:  Functional colostomy, status post takedown of colostomy.  ADDITIONAL DIAGNOSIS:  Include postoperative anemia.  PRINCIPAL PROCEDURE:  Takedown colostomy.  SURGEON:  Marta Lamas. Lindie Spruce, M.D.  DISCHARGE MEDICATIONS: 1. Tramadol 50 mg tablets one p.o. q.6 h. p.r.n. for pain. 2. Phenergan 12.5 mg tablets one p.o. q.8 h. p.r.n. nausea. 3. He also takes iron and vitamins at home.  DISCHARGE DIET:  His diet on discharge is soft.  CONDITION ON DISCHARGE:  Stable.  FOLLOWUP:  He will follow up on Monday, August 6, to see my nurse in [facility] office for staple removal.  He will see me the following week for followup.  BRIEF SUMMARY OF HOSPITAL COURSE:  The patient was admitted on October 03, 2010, for a takedown of colostomy that he had received status post colectomy and subsequent anastomotic leak.  He did well in the intervening time, was in good shape prior to the surgery and underwent a sigmoid colectomy, reversal of colostomy, and takedown with low anterior resection.  The anastomosis was complete with no evidence of leak with Betadine, no air, at the time of surgery.  The patient was on alvimopan postoperatively and did well, had his first bowel movement on postop day #3 and has been advanced to a soft diet which he is tolerating well.  His maximum temperature postoperatively was 100.4 on postop day #2, within the last 24 hours was 99.0, and he is currently afebrile.  His pulse is 79.  His blood pressure is good.  After initial hemoglobin postoperatively of 12.3, he dropped down to a low of 8.4 postoperatively.  He did  receive some postoperative Lovenox which was stopped.  His DVT prophylaxis subsequently was with PAS hose. He has developed no swelling, edema or pain in his cast, indicating no evidence of DVT.  He has remained hemodynamically stable.  His urine output is good.  His blood pressure is normal.  His last hemoglobin was 8.4 which was 2 days ago.  We have not rechecked it, as he has remained stable with good urine output since that time.  This patient was started on iron and vitamins because he states that the compounds given to the patient in the hospital make him weak and feels queasy on the stomach.  Therefore, he is going to use what he had taken postoperatively before and that he has at home.  At the time of discharge, his wound is healing well with a small amount of bloody drainage in the lower aspect but no pus.  No purulent drainage.  Stoma site looks good.  No evidence of infection.  He has had several bowel movement since postoperative day #3 and he is passing gas.  He has excellent bowel sounds.  He has no abdominal tenderness.  He will return to see me on  October 21, 2010, to see my nurse on the August 6 for staple removal.     Cherylynn Ridges, M.D.     JOW/MEDQ  D:  10/08/2010  T:  10/08/2010  Job:  [id number]  Electronically Signed by Jimmye Norman M.D. on 10/08/2010 02:39:59 PM

## 2010-10-10 ENCOUNTER — Other Ambulatory Visit (INDEPENDENT_AMBULATORY_CARE_PROVIDER_SITE_OTHER): Payer: Self-pay

## 2010-10-13 ENCOUNTER — Ambulatory Visit (INDEPENDENT_AMBULATORY_CARE_PROVIDER_SITE_OTHER): Payer: BC Managed Care – PPO

## 2010-10-13 ENCOUNTER — Telehealth (INDEPENDENT_AMBULATORY_CARE_PROVIDER_SITE_OTHER): Payer: Self-pay

## 2010-10-13 DIAGNOSIS — Z4802 Encounter for removal of sutures: Secondary | ICD-10-CM

## 2010-10-13 NOTE — Progress Notes (Signed)
Kalvyn came in for staple removal. Incision has healed well. Steri strips were applied.

## 2010-10-13 NOTE — Telephone Encounter (Signed)
Michael Santiago is wanting a different kind of nausea medicine. He says the Phenergan generic is making him "feel weird." He also wants to know if you want to have his iron checked because he is feeling real weak. He stated he hasn't been able to take his iron pills.

## 2010-10-15 ENCOUNTER — Telehealth (INDEPENDENT_AMBULATORY_CARE_PROVIDER_SITE_OTHER): Payer: Self-pay

## 2010-10-15 ENCOUNTER — Other Ambulatory Visit (INDEPENDENT_AMBULATORY_CARE_PROVIDER_SITE_OTHER): Payer: Self-pay | Admitting: General Surgery

## 2010-10-15 ENCOUNTER — Ambulatory Visit
Admission: RE | Admit: 2010-10-15 | Discharge: 2010-10-15 | Disposition: A | Discharge status: Home / Self Care | Payer: BC Managed Care – PPO | Source: Ambulatory Visit | Attending: General Surgery | Admitting: General Surgery

## 2010-10-15 DIAGNOSIS — R140 Abdominal distension (gaseous): Secondary | ICD-10-CM

## 2010-10-15 NOTE — Telephone Encounter (Signed)
Pt's wife called and states that the pt has had no bm for a few days.  He called the on call surgeon this weekend and they advised miralax bid.  He tried that but it only made a small bm early this week.  He is nauseated but not vomiting.  The Zofran is not working.  His abdomen is distended.  I paged Dr Lindie Spruce and he ordered a 3 way abd series.  I notified the wife Steward Drone and she will take him this am.

## 2010-10-15 NOTE — Telephone Encounter (Signed)
Per dr Lindie Spruce xray looks ok.  Have pt stay on liquids until nausea better, stop Miralax.  Take Colace 100mg  po bid, and Metamucil mid.  Minimize the pain med he is taking.  I called and explained this to the wife.

## 2010-10-17 ENCOUNTER — Encounter (INDEPENDENT_AMBULATORY_CARE_PROVIDER_SITE_OTHER): Payer: Self-pay | Admitting: Surgery

## 2010-10-17 ENCOUNTER — Telehealth (INDEPENDENT_AMBULATORY_CARE_PROVIDER_SITE_OTHER): Payer: Self-pay

## 2010-10-17 ENCOUNTER — Ambulatory Visit (INDEPENDENT_AMBULATORY_CARE_PROVIDER_SITE_OTHER): Payer: BC Managed Care – PPO | Admitting: Surgery

## 2010-10-17 ENCOUNTER — Emergency Department (HOSPITAL_COMMUNITY)
Admission: EM | Admit: 2010-10-17 | Discharge: 2010-10-17 | Disposition: A | Discharge status: Home / Self Care | Payer: BC Managed Care – PPO | Attending: Emergency Medicine | Admitting: Emergency Medicine

## 2010-10-17 DIAGNOSIS — Z9889 Other specified postprocedural states: Secondary | ICD-10-CM | POA: Insufficient documentation

## 2010-10-17 DIAGNOSIS — R109 Unspecified abdominal pain: Secondary | ICD-10-CM

## 2010-10-17 DIAGNOSIS — R112 Nausea with vomiting, unspecified: Secondary | ICD-10-CM | POA: Insufficient documentation

## 2010-10-17 LAB — URINALYSIS, ROUTINE W REFLEX MICROSCOPIC
Ketones, ur: >80 mg/dL — AB
Leukocytes, UA: NEGATIVE
Nitrite: NEGATIVE
Protein, ur: 100 mg/dL — AB
Urobilinogen, UA: 1 mg/dL (ref 0.0–1.0)

## 2010-10-17 LAB — BASIC METABOLIC PANEL
CO2: 29 mEq/L (ref 19–32)
Calcium: 10.1 mg/dL (ref 8.4–10.5)
Creatinine, Ser: 0.72 mg/dL (ref 0.50–1.35)
GFR calc Af Amer: >60 mL/min (ref >60)
GFR calc non Af Amer: >60 mL/min (ref >60)
Sodium: 136 mEq/L (ref 135–145)

## 2010-10-17 LAB — DIFFERENTIAL
Basophils Relative: 0 % (ref 0–1)
Eosinophils Absolute: 0 10*3/uL (ref 0.0–0.7)
Eosinophils Relative: 0 % (ref 0–5)
Monocytes Relative: 8 % (ref 3–12)
Neutrophils Relative %: 78 % — ABNORMAL HIGH (ref 43–77)

## 2010-10-17 LAB — CBC
MCH: 29.9 pg (ref 26.0–34.0)
MCHC: 35.3 g/dL (ref 30.0–36.0)
Platelets: 552 10*3/uL — ABNORMAL HIGH (ref 150–400)
RBC: 3.61 MIL/uL — ABNORMAL LOW (ref 4.22–5.81)
RDW: 15 % (ref 11.5–15.5)

## 2010-10-17 LAB — URINE MICROSCOPIC-ADD ON

## 2010-10-17 NOTE — Telephone Encounter (Signed)
Pt and his wife called concerned that pt is having increased abd pain,nausea, decreased appitite, darkening urine and behavior changes. Dr Lindie Spruce contacted and symptoms reviewed. Per Dr Dixon Boos order pt given appt to see Dr Daphine Deutscher in urgent office clinic today for assessment.

## 2010-10-18 LAB — URINE CULTURE
Colony Count: NO GROWTH
Culture  Setup Time: 201208110140
Culture: NO GROWTH

## 2010-10-18 NOTE — Progress Notes (Signed)
This patient and his wife came in today because of dehydration and feeling bad. He is about 2 weeks out from colostomy closure. He has had some problems with his mouth and has been treated for thrush although he didn't claim it is any better. He has a dry mouth and mucous membranes suggesting heis volume depleted. His is not having any abdominal pain.  I discussed this patient with Dr. Bonnye Halle Folks. and we'll send him to the [facility] ER for IV administration.

## 2010-10-20 ENCOUNTER — Emergency Department (HOSPITAL_COMMUNITY)
Admission: EM | Admit: 2010-10-20 | Discharge: 2010-10-20 | Disposition: A | Discharge status: Home / Self Care | Payer: BC Managed Care – PPO | Attending: Emergency Medicine | Admitting: Emergency Medicine

## 2010-10-20 ENCOUNTER — Telehealth (INDEPENDENT_AMBULATORY_CARE_PROVIDER_SITE_OTHER): Payer: Self-pay

## 2010-10-20 ENCOUNTER — Telehealth (INDEPENDENT_AMBULATORY_CARE_PROVIDER_SITE_OTHER): Payer: Self-pay | Admitting: General Surgery

## 2010-10-20 DIAGNOSIS — Z8719 Personal history of other diseases of the digestive system: Secondary | ICD-10-CM | POA: Insufficient documentation

## 2010-10-20 DIAGNOSIS — Z9049 Acquired absence of other specified parts of digestive tract: Secondary | ICD-10-CM | POA: Insufficient documentation

## 2010-10-20 DIAGNOSIS — IMO0002 Reserved for concepts with insufficient information to code with codable children: Secondary | ICD-10-CM | POA: Insufficient documentation

## 2010-10-20 DIAGNOSIS — Y838 Other surgical procedures as the cause of abnormal reaction of the patient, or of later complication, without mention of misadventure at the time of the procedure: Secondary | ICD-10-CM | POA: Insufficient documentation

## 2010-10-20 LAB — DIFFERENTIAL
Basophils Absolute: 0 10*3/uL (ref 0.0–0.1)
Basophils Relative: 0 % (ref 0–1)
Eosinophils Absolute: 0 10*3/uL (ref 0.0–0.7)
Lymphocytes Relative: 12 % (ref 12–46)
Monocytes Absolute: 0.8 10*3/uL (ref 0.1–1.0)
Neutrophils Relative %: 82 % — ABNORMAL HIGH (ref 43–77)
WBC Morphology: INCREASED

## 2010-10-20 LAB — CBC
HCT: 31.7 % — ABNORMAL LOW (ref 39.0–52.0)
Hemoglobin: 10.8 g/dL — ABNORMAL LOW (ref 13.0–17.0)
MCH: 29.1 pg (ref 26.0–34.0)
MCHC: 34.1 g/dL (ref 30.0–36.0)
RDW: 15.1 % (ref 11.5–15.5)

## 2010-10-20 NOTE — Telephone Encounter (Signed)
Pt's wife called stating pt is having some swelling at the incision of the colostomy takedown. The pt has an appt tomorrow with Dr Lindie Spruce. I asked for the wife to take the pt's temp which was 99.1. I advised the pt to use warm heating pad,to take it easy today, and if anything changed with temp. Or nausea and vomiting to call our office back. We would see them tomorrow./ [facility]

## 2010-10-20 NOTE — Telephone Encounter (Signed)
Patient called and said he was out doing errands and by the time he got home incision area was swollen and a little red. I told patient to put warm compresses on area an to sit and take it easy. Told him to keep appt to see [facility] tomorrow.

## 2010-10-21 ENCOUNTER — Ambulatory Visit (INDEPENDENT_AMBULATORY_CARE_PROVIDER_SITE_OTHER): Payer: BC Managed Care – PPO | Admitting: General Surgery

## 2010-10-21 ENCOUNTER — Encounter (INDEPENDENT_AMBULATORY_CARE_PROVIDER_SITE_OTHER): Payer: Self-pay | Admitting: General Surgery

## 2010-10-21 VITALS — Temp 98.0°F | Wt 146.0 lb

## 2010-10-21 DIAGNOSIS — S31109A Unspecified open wound of abdominal wall, unspecified quadrant without penetration into peritoneal cavity, initial encounter: Secondary | ICD-10-CM

## 2010-10-21 NOTE — Progress Notes (Signed)
HPI The patient comes in after being seen in the hospital last night for drainage from his lower portion of this wound. In the area of his wound he has a 3.5 x 1 cm opening with some some mildly granulating subcutaneous tissue. He was sent to the office today for further evaluation. He did not have any fevers and chills. He was seen in his white blood cell count was 13,000 his hemoglobin was 10.  PE On examination he has a 4 x 1 cm area of open wound and the lower portion of his abdominal wound with early granulation tissue does not seem to be infected. Gram stain of the fluid did not show any evidence of any organisms and currently there is no growth.  Studiy review I reviewed the patient's CBC which demonstrates a hemoglobin of 10.2 and a white blood cell count of 13.4 .  Assessment Hematoma of the wound without evidence of infection at this time. The patient's oral thrush has improved on Diflucan therapy  Plan The patient will start wet-to-dry dressings with normal saline and over 4 gauze twice a day. He can shower prior to a first dressing change in the morning at the bone dried then performed a dressing change. He'll see the patient back in 2 weeks for reassessment.  She continues Diflucan therapy until he has run out of the antibiotic

## 2010-10-21 NOTE — Patient Instructions (Signed)
I discussed with the patient and his wife dressing changes using sterile saline and wet-to-dry dressings with 4 x 4 gauze .  The patient is discharged once a day with his wound opened along the soap water to rollover and padded dry. She undergo dressing changes at least twice a day I will see him back in 2 weeks at which time he may be converted once the dressings and shortly after that placed on antibiotic ointment and dressings .

## 2010-10-24 ENCOUNTER — Inpatient Hospital Stay (HOSPITAL_COMMUNITY)
Admission: AD | Admit: 2010-10-24 | Discharge: 2010-10-29 | DRG: 580 | Disposition: A | Discharge status: Home / Self Care | Payer: BC Managed Care – PPO | Source: Ambulatory Visit | Attending: General Surgery | Admitting: General Surgery

## 2010-10-24 ENCOUNTER — Telehealth (INDEPENDENT_AMBULATORY_CARE_PROVIDER_SITE_OTHER): Payer: Self-pay

## 2010-10-24 ENCOUNTER — Ambulatory Visit
Admission: RE | Admit: 2010-10-24 | Discharge: 2010-10-24 | Disposition: A | Discharge status: Home / Self Care | Payer: BC Managed Care – PPO | Source: Ambulatory Visit | Attending: General Surgery | Admitting: General Surgery

## 2010-10-24 DIAGNOSIS — K651 Peritoneal abscess: Secondary | ICD-10-CM | POA: Diagnosis present

## 2010-10-24 DIAGNOSIS — B370 Candidal stomatitis: Secondary | ICD-10-CM | POA: Diagnosis present

## 2010-10-24 DIAGNOSIS — Z9049 Acquired absence of other specified parts of digestive tract: Secondary | ICD-10-CM

## 2010-10-24 DIAGNOSIS — B9689 Other specified bacterial agents as the cause of diseases classified elsewhere: Secondary | ICD-10-CM | POA: Diagnosis present

## 2010-10-24 DIAGNOSIS — Y832 Surgical operation with anastomosis, bypass or graft as the cause of abnormal reaction of the patient, or of later complication, without mention of misadventure at the time of the procedure: Secondary | ICD-10-CM | POA: Diagnosis present

## 2010-10-24 DIAGNOSIS — T8140XA Infection following a procedure, unspecified, initial encounter: Principal | ICD-10-CM | POA: Diagnosis present

## 2010-10-24 LAB — WOUND CULTURE

## 2010-10-24 LAB — CBC
HCT: 29 % — ABNORMAL LOW (ref 39.0–52.0)
Hemoglobin: 9.7 g/dL — ABNORMAL LOW (ref 13.0–17.0)
MCH: 28.9 pg (ref 26.0–34.0)
MCV: 86.3 fL (ref 78.0–100.0)
RBC: 3.36 MIL/uL — ABNORMAL LOW (ref 4.22–5.81)
WBC: 8.9 10*3/uL (ref 4.0–10.5)

## 2010-10-24 LAB — BASIC METABOLIC PANEL
BUN: 12 mg/dL (ref 6–23)
CO2: 28 mEq/L (ref 19–32)
Calcium: 9.8 mg/dL (ref 8.4–10.5)
Chloride: 97 mEq/L (ref 96–112)
Creatinine, Ser: 0.76 mg/dL (ref 0.50–1.35)
Glucose, Bld: 105 mg/dL — ABNORMAL HIGH (ref 70–99)

## 2010-10-24 LAB — DIFFERENTIAL
Lymphocytes Relative: 29 % (ref 12–46)
Lymphs Abs: 2.6 10*3/uL (ref 0.7–4.0)
Monocytes Relative: 6 % (ref 3–12)
Neutrophils Relative %: 63 % (ref 43–77)

## 2010-10-24 MED ORDER — IOHEXOL 300 MG/ML  SOLN
100.0000 mL | Freq: Once | INTRAMUSCULAR | Status: AC | PRN
  Administered 2010-10-24: 100 mL via INTRAVENOUS

## 2010-10-24 NOTE — Telephone Encounter (Signed)
Pt calling requesting to speak to Dr Lindie Spruce this am b/c having trouble with his abdominal wound draining so much. I tried to talk to him about what was going on but the pt said he really just wanted to speak to his doctor. I explained to him that his doctor was covering trauma call but I could try to help him or get his nurse involved. The pt replied that he has called and spoke to several nurses but he feels no one is helping him. The pt feels very frustrated with this abdominal wound that keeps draining so much and the pt keeps having to change the bandages every hour. I tried to put him on hold so I could go and speak to [place] but the pt did not wait for me to return.Marcelino Duster advised me to page Dr Lindie Spruce with this info so Dr Lindie Spruce could call pt. I called the pt back and the pt's wife answered the phone and I explained that I would page Dr Lindie Spruce to call them./ [facility]

## 2010-10-25 ENCOUNTER — Inpatient Hospital Stay (HOSPITAL_COMMUNITY): Payer: BC Managed Care – PPO

## 2010-10-25 LAB — BASIC METABOLIC PANEL
GFR calc Af Amer: >60 mL/min (ref >60)
GFR calc non Af Amer: >60 mL/min (ref >60)
Glucose, Bld: 111 mg/dL — ABNORMAL HIGH (ref 70–99)
Potassium: 4.2 mEq/L (ref 3.5–5.1)
Sodium: 140 mEq/L (ref 135–145)

## 2010-10-25 LAB — DIFFERENTIAL
Basophils Relative: 1 % (ref 0–1)
Eosinophils Absolute: 0.2 10*3/uL (ref 0.0–0.7)
Eosinophils Relative: 3 % (ref 0–5)
Lymphs Abs: 2.2 10*3/uL (ref 0.7–4.0)
Monocytes Absolute: 0.4 10*3/uL (ref 0.1–1.0)
Monocytes Relative: 6 % (ref 3–12)
Neutrophils Relative %: 55 % (ref 43–77)

## 2010-10-25 LAB — CBC
MCH: 29 pg (ref 26.0–34.0)
MCHC: 33.9 g/dL (ref 30.0–36.0)
MCV: 85.6 fL (ref 78.0–100.0)
Platelets: 617 10*3/uL — ABNORMAL HIGH (ref 150–400)
RBC: 3.69 MIL/uL — ABNORMAL LOW (ref 4.22–5.81)

## 2010-10-25 LAB — PROTIME-INR
INR: 1.09 (ref 0.00–1.49)
Prothrombin Time: 14.3 seconds (ref 11.6–15.2)

## 2010-10-28 LAB — BODY FLUID CULTURE

## 2010-10-28 NOTE — H&P (Signed)
NAMEMarland Kitchen  ARAS, ALBARRAN NO.:  [id number]  MEDICAL RECORD NO.:  [id number]  LOCATION:  1522                         FACILITY:  [facility]  PHYSICIAN:  Cherylynn Ridges, M.D.    DATE OF BIRTH:  1961/02/12  DATE OF ADMISSION:  10/24/2010 DATE OF DISCHARGE:                             HISTORY & PHYSICAL   CHIEF COMPLAINT:  The patient is a 50 year old patient status post takedown colostomy who now has an intraabdominal abscess.  HISTORY OF PRESENT ILLNESS:  The patient underwent a takedown on colostomy which had resulted from a leak of an anastomosis.  This takedown was done on October 03, 2010.  The patient had a very short hospitalization and did well initially, had some difficulty with bowel movements early on.  Over the last 2 weeks, he has had increasing problems with some abdominal discomfort and also drainage from his wound.  He calls it bleeding from his wound but when he came to the emergency room on October 20, 2010, he had a partially open wound which was about 2 cm x 4 or 5 cm in size with what appeared to be some partially small foul-smelling old blood coming from the wound.  Cultures were taken at that time.  There is no grow from that at that time.  I got a call from the patient today because of increasing amount of drainage from the wound.  Based on this I had a CT scan of the abdomen and pelvis performed which demonstrated some larger intraabdominal abscess, it is extending down towards the pelvis.  There was no gas in these abscess cavities indicating it is unlikely that they are connected to the bowel and unlikely secondary to a fistula.  Also most of the fluid draining appears to be old blood and it is not highly foul smelling.  Based on these findings I am readmitting the patient for IV antibiotics in case it is an infected hematoma.  I plan on putting him on Invanz 1 g daily.  Also he is scheduled to get a percutaneous drainage of this abscess or will be  scheduled to do so tomorrow by Interventional Radiology.  PAST MEDICAL HISTORY:  As stated in previous H and P.  PAST SURGERIES:  He had a sigmoid colectomy and postoperative leak with a colectomy and colostomy and subsequently takedown colostomy.  CURRENT MEDICATIONS:  Pain medications, antinausea medications including Zofran and Phenergan.  He is also taking Diflucan for oral thrush which he developed after starting on antibiotics.  Based on his last visit to see me in the office, the patient is afebrile.  His vital signs are stable.  He had a 4 cm x 1 cm area of an open wound in lower portion of the abdomen which is granulating well and was not draining much fluid at that time, I had him cough and stand up on Valsalva, could not gain any express, felt as if this is likely just a wound hematoma which was draining.  However, based on the studies today,  the patient has a large abscess cavity which needs to be drained.  His CBC on his last check was 13,400 which is down.  He was  afebrile the last time he was seen.  PLAN:  The plan is to admit him for IV antibiotics and percutaneous drainage.  If all works out well he may be able to go home on Sunday after being admitted on Friday for the procedure to be done on Saturday. I have been interactive with the Interventional Radiology about the patient being admitted.     Cherylynn Ridges, M.D.     JOW/MEDQ  D:  10/24/2010  T:  10/25/2010  Job:  [id number]  Electronically Signed by Jimmye Norman M.D. on 10/28/2010 12:06:35 AM

## 2010-11-11 ENCOUNTER — Encounter (INDEPENDENT_AMBULATORY_CARE_PROVIDER_SITE_OTHER): Payer: BC Managed Care – PPO | Admitting: General Surgery

## 2010-11-11 ENCOUNTER — Telehealth (INDEPENDENT_AMBULATORY_CARE_PROVIDER_SITE_OTHER): Payer: Self-pay | Admitting: General Surgery

## 2010-11-11 NOTE — Telephone Encounter (Signed)
Called patient and spoke to wife. Scheduled an appointment for him to see wyatt on the 18th. They are aware of appointment date and time. Also told them I would look for disability forms and see about extending the date from sept 11. The date had already been extended to Jan 07, 2011 so I faxed them into [place] for the patient.

## 2010-11-17 NOTE — Discharge Summary (Signed)
Michael Santiago, Michael Santiago NO.:  [id number]  MEDICAL RECORD NO.:  [id number]  LOCATION:  1522                         FACILITY:  [facility]  PHYSICIAN:  Cherylynn Ridges, M.D.    DATE OF BIRTH:  07-05-1960  DATE OF ADMISSION:  10/24/2010 DATE OF DISCHARGE:  10/29/2010                              DISCHARGE SUMMARY   DISCHARGE DIAGNOSIS:  Infected intra-abdominal hematoma, status post takedown colostomy.  PRINCIPAL PROCEDURE:  Percutaneous drainage and aspiration of abdominal hematoma.  DISCHARGE INSTRUCTIONS:  The patient is to do dressing changes to his small 4 x 2 cm open abdominal wound, using wet-to-dry saline with 4 x 4 gauze.  Also, he is to change it on a p.r.n. basis.  His medications on discharge include pain medicine ordered previously along with: 1. Ciprofloxacin 500 mg p.o. b.i.d. for 7 days. 2. Flagyl 500 mg p.o. 3 times a day for 7 days. 3. Diflucan 100 mg p.o. daily for 7 days.  He is to return to see me on November 11, 2010.  His diet is regular.  He is to continue to take Metamucil or psyllium supplement for his bowel movements and docusate sodium twice a day for bowel movement.  He did take MiraLax in the hospital.  He can take that on a p.r.n. basis.  BRIEF SUMMARY OF HOSPITAL COURSE:  The patient was admitted on October 24, 2010, after he had a large amount of what he called blood clots, draining from an open lower abdominal wound.  This prompted a CT scan, which demonstrated a large fluid collection in his pelvis and also in his lower abdomen, which was thought to be an abdominal abscess.  The patient was afebrile.  His white count was normal.  His hemoglobin was slightly low.  This patient had been noted to have had a drop in his hemoglobin postoperatively after takedown of his colostomy previously.  He had been seen in the office, was doing well with the exception of this open wound, which we could not get to drain any significant amount of  fluid; however, with the continued drainage currently, a CT scan was done.  With these findings, the patient was brought into the hospital for IV antibiotics and a percutaneous drain.  He was started on Invanz therapy and then subsequently had a percutaneous drain placed on Saturday, which was October 25, 2010.  At that time, old blood was returned with not a lot of pus.  No pus actually was drained.  It was left to gravity drainage with irrigation being done on a daily basis.  In spite of  the irrigation, he is felt to drain more than 10 to 20 mL of what appears to be old blood.  Cultures from that drainage have demonstrated multiple anaerobic organisms, none of which predominate.  With the minimal efficacy of the percutaneous drain, I felt so, it was not contributing much to the patient's recovery.  He has had several bowel movements in the hospital.  He is passing gas.  There is no gas passage from his wound.  It was the part of the percutaneous catheter underneath the open wound in the mid abdominal wound; however, itappears to  only drain mostly when irrigation has been placed in the tubing itself.  Over time, this may liquify and drain more from his abdominal wound; however, currently I do not think that we need to keep the percutaneous drain in as it may be portal for further bacterial contamination.  Because the culture did grow out multiple anaerobic organisms, perhaps that was contamination of the hematoma leading to an early infection,  I will keep the patient on Flagyl and ciprofloxacin, and because he tends to develop __________ and candidiasis in his throat, I will place him on Diflucan also.  I will see him back in my office on November 11, 2010, for reassessment.  He has our number to contact us before then.     Cherylynn Ridges, M.D.     JOW/MEDQ  D:  10/29/2010  T:  10/29/2010  Job:  [id number]  Electronically Signed by Jimmye Norman M.D. on 11/17/2010 08:49:48 AM

## 2010-11-25 ENCOUNTER — Encounter (INDEPENDENT_AMBULATORY_CARE_PROVIDER_SITE_OTHER): Payer: Self-pay | Admitting: General Surgery

## 2010-11-25 ENCOUNTER — Ambulatory Visit (INDEPENDENT_AMBULATORY_CARE_PROVIDER_SITE_OTHER): Payer: BC Managed Care – PPO | Admitting: General Surgery

## 2010-11-25 VITALS — BP 106/76 | HR 70 | Temp 98.6°F | Resp 18 | Ht 71.0 in | Wt 156.0 lb

## 2010-11-25 DIAGNOSIS — Z09 Encounter for follow-up examination after completed treatment for conditions other than malignant neoplasm: Secondary | ICD-10-CM

## 2010-11-25 NOTE — Progress Notes (Signed)
HPI The patient is doing very well.  PE On examination his midline wound as well. There is a small suture that extending through the incision that I did remove. After that I covered it with a Band-Aid.  Studiy review There are no studies to review Michael Santiago  Assessment The patient is cleared to go back to work on December 08, 2010. He is discharged from clinic in that she has further problems. I have given him clear instructions in terms of his ability to back to usual activities. This includes been able to play golf and yardwork.  Plan The patient is discharged from clinic to return to see me on a p.r.n. basis.

## 2010-11-25 NOTE — Patient Instructions (Signed)
The patient may return to work on December 08, 2010 without restriction.  The patient had had sex with his wife without restriction.  The patient and start usual hyperactivity and skin including yardwork and golf and other extracurricular activity.  He should keep his incision covered for the next 2 weeks or so until he is further out from surgery to prevent irritation. No x-rays are needed at this time

## 2011-03-04 NOTE — Telephone Encounter (Signed)
Advice only

## 2012-04-19 ENCOUNTER — Ambulatory Visit (INDEPENDENT_AMBULATORY_CARE_PROVIDER_SITE_OTHER): Payer: BC Managed Care – PPO | Admitting: General Surgery

## 2012-04-19 ENCOUNTER — Encounter (INDEPENDENT_AMBULATORY_CARE_PROVIDER_SITE_OTHER): Payer: Self-pay | Admitting: General Surgery

## 2012-04-19 VITALS — BP 114/76 | HR 76 | Resp 14 | Ht 71.0 in | Wt 176.8 lb

## 2012-04-19 DIAGNOSIS — R109 Unspecified abdominal pain: Secondary | ICD-10-CM

## 2012-04-19 NOTE — Progress Notes (Signed)
The patient comes in with complaints of abdominal pain in the midline and also the right upper quadrant associated with bending and standing up. On multiple occasions he's noted that his and thought that he possibly hernia.  He states that he did see a bulge on the right side of his abdomen near the upper portion of his incision but has not been able to demonstrate any of his symptoms for the last 2-3 weeks.  On examination today the patient has a rock solid abdominal wall fascia without any demonstrable ventral or incisional hernia. I examined the patient in the upright standing position and in the supine position. Had him bowel small bowel but no evidence of a recurrent hernia. There is no evidence of a hernia at his previous colostomy site.  At this point I cannot demonstrate an incisional ventral hernia however because of his recurrent symptoms it may be that we're just not able to demonstrate it today. I've asked the patient states contact with me should have symptoms again. And also if he should note any demonstrable bulge that is intermittently reducible and he should make an appointment to see me again.

## 2012-09-13 ENCOUNTER — Telehealth (INDEPENDENT_AMBULATORY_CARE_PROVIDER_SITE_OTHER): Payer: Self-pay | Admitting: *Deleted

## 2012-09-13 NOTE — Telephone Encounter (Signed)
Patient's wife called to state patient is having left lower abdominal pain.  Patient states that he hasn't had a bowel movement since Sunday but has been taking Miralax 1 dose daily with still no bowel movement.  Patient reports walking decreases pain however he has to take small steps.  Patient also reports that eating causes cramping in his lower abdomen.  Spoke to Dr. Jamey Ripa who states patient needs to be evaluated in urgent office tomorrow or the ED but patient needs to be assessed by an MD and Dr. Lindie Spruce is unavailable for office visit until 09/27/12.  Patient chose urgent office tomorrow so appt was made.

## 2012-09-14 ENCOUNTER — Encounter (INDEPENDENT_AMBULATORY_CARE_PROVIDER_SITE_OTHER): Payer: BC Managed Care – PPO | Admitting: Surgery

## 2013-06-16 IMAGING — RF DG BE W/ CM - WO/W KUB
11 series · 11 of 11 positions shown · IV contrast (agent unspecified)
Comparison: None.

CLINICAL DATA: Elzarit Bexhet procedure.

SINGLE CONTRAST BARIUM ENEMA
Fluoroscopy Time: 2.5 minutes
Contrast: Water soluble contrast.

[Series 1: run · 1 of 1 slices shown (1 of 11)]
[im 1/1]
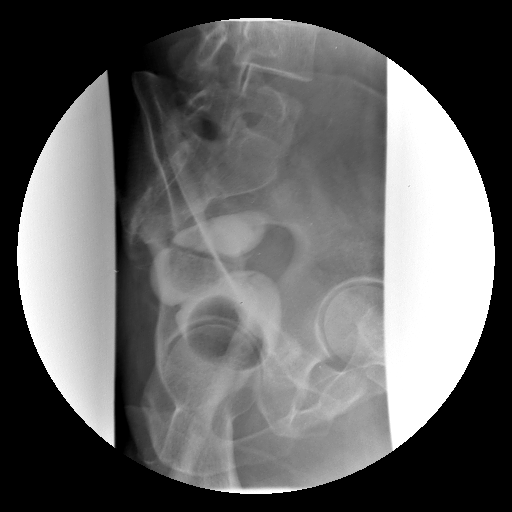

[Series 2: run · 1 of 1 slices shown (2 of 11)]
[im 1/1]
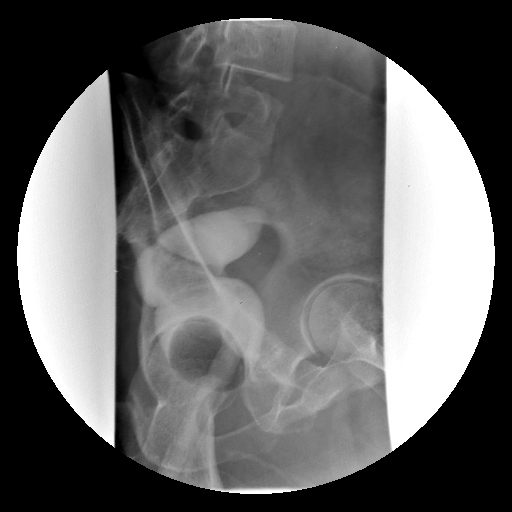

[Series 3: run · 1 of 1 slices shown (3 of 11)]
[im 1/1]
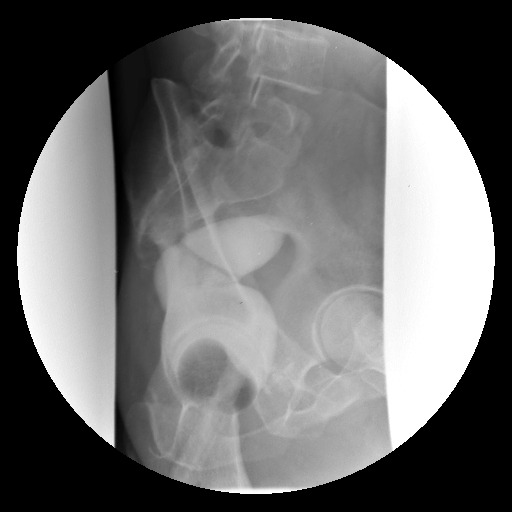

[Series 4: run · 1 of 1 slices shown (4 of 11)]
[im 1/1]
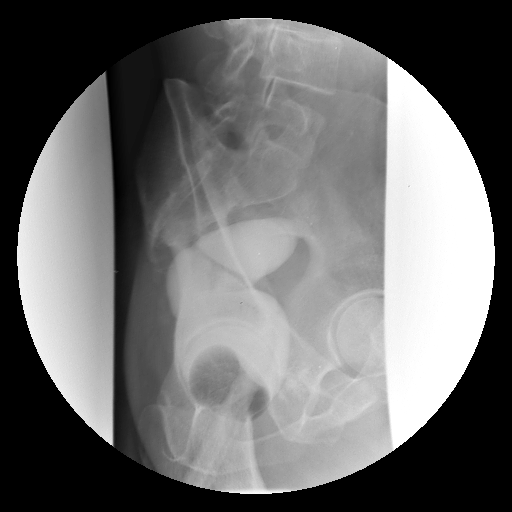

[Series 5: run · 1 of 1 slices shown (5 of 11)]
[im 1/1]
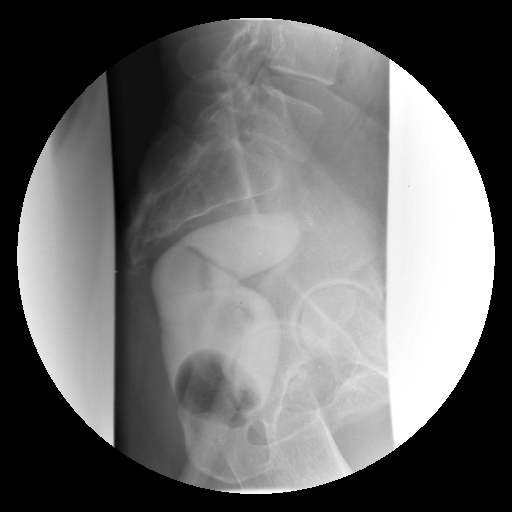

[Series 6: run · 1 of 1 slices shown (6 of 11)]
[im 1/1]
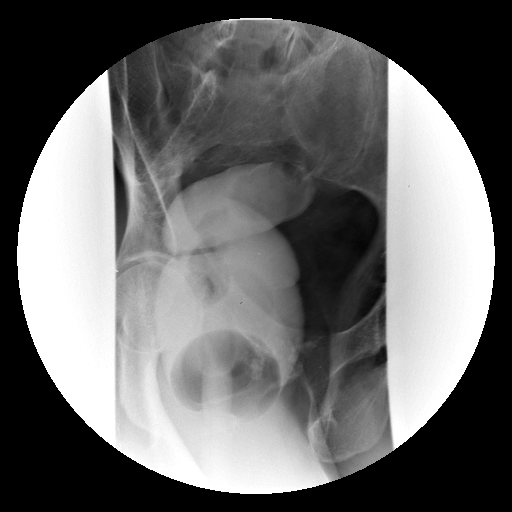

[Series 7: run · 1 of 1 slices shown (7 of 11)]
[im 1/1]
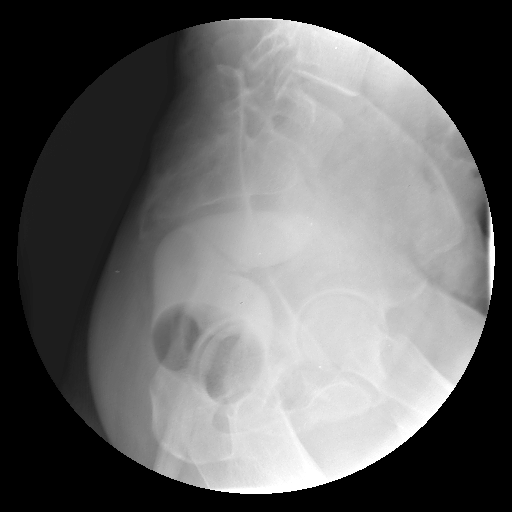

[Series 8: run · 1 of 1 slices shown (8 of 11)]
[im 1/1]
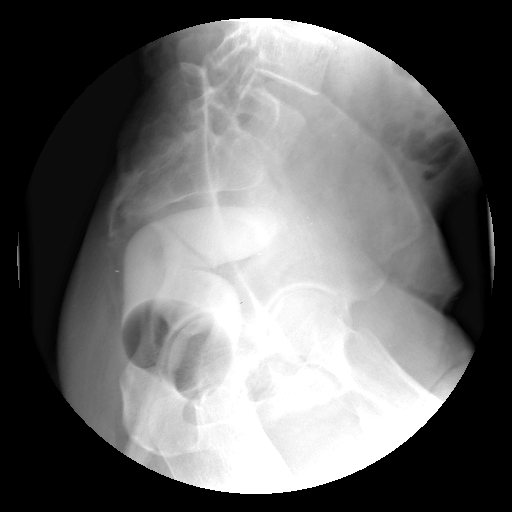

[Series 9: run · 1 of 1 slices shown (9 of 11)]
[im 1/1]
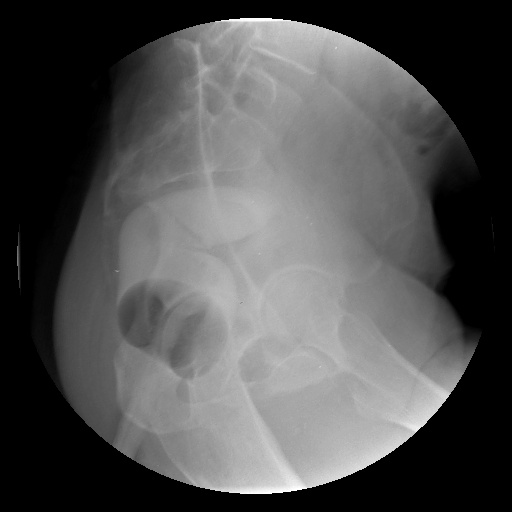

[Series 10: run · 1 of 1 slices shown (10 of 11)]
[im 1/1]
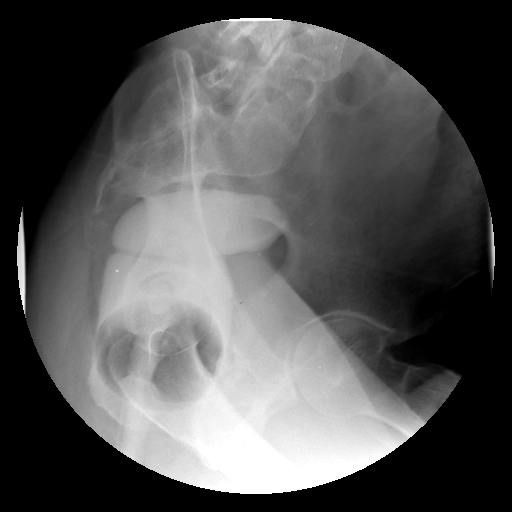

[Series 11: run · 1 of 1 slices shown (11 of 11)]
[im 1/1]
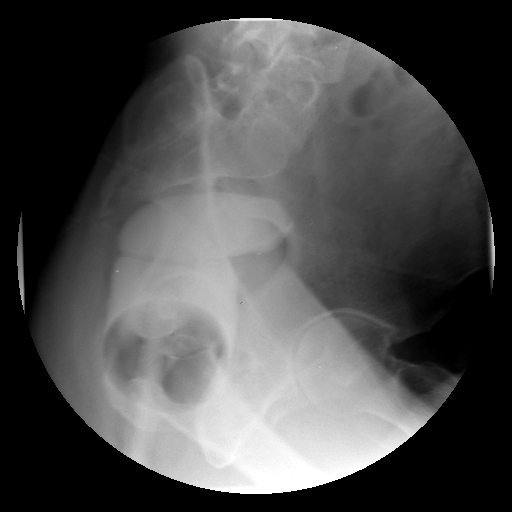

[11 of 11 positions shown; findings below may reference images not displayed]

FINDINGS: On the preliminary examination prostatic calcifications
are seen indicating previous prostatitis or trauma.

Water soluble contrast was introduced through the rectum the rectal
tube.  The patient was unable to retain the contrast actually.
Rectal balloon was inflated under fluoroscopic control and filling
of the rectum was obtained.  The appearance of the rectum is
consistent with the history given of Liew procedure.  No
irregularity or inflammation was seen.  No fistula was evident. No
rectal mass was evident.
IMPRESSION: The appearance of the rectum is consistent with the history given
of Liew procedure.  No irregularity or inflammation was seen.

Prostatic calcifications consistent with previous prostatitis or
trauma.

## 2013-09-14 ENCOUNTER — Ambulatory Visit
Admission: RE | Admit: 2013-09-14 | Discharge: 2013-09-14 | Disposition: A | Discharge status: Home / Self Care | Payer: BC Managed Care – PPO | Source: Ambulatory Visit | Attending: Physician Assistant | Admitting: Physician Assistant

## 2013-09-14 ENCOUNTER — Other Ambulatory Visit: Payer: Self-pay | Admitting: Physician Assistant

## 2013-09-14 DIAGNOSIS — R52 Pain, unspecified: Secondary | ICD-10-CM

## 2014-04-12 ENCOUNTER — Other Ambulatory Visit: Payer: Self-pay | Admitting: Dermatology

## 2014-05-03 ENCOUNTER — Ambulatory Visit
Admission: RE | Admit: 2014-05-03 | Discharge: 2014-05-03 | Disposition: A | Discharge status: Home / Self Care | Payer: BLUE CROSS/BLUE SHIELD | Source: Ambulatory Visit | Attending: Physician Assistant | Admitting: Physician Assistant

## 2014-05-03 ENCOUNTER — Other Ambulatory Visit: Payer: Self-pay | Admitting: Physician Assistant

## 2014-05-03 DIAGNOSIS — T1490XA Injury, unspecified, initial encounter: Secondary | ICD-10-CM

## 2015-01-17 ENCOUNTER — Ambulatory Visit
Admission: RE | Admit: 2015-01-17 | Discharge: 2015-01-17 | Disposition: A | Discharge status: Home / Self Care | Payer: BLUE CROSS/BLUE SHIELD | Source: Ambulatory Visit | Attending: Physician Assistant | Admitting: Physician Assistant

## 2015-01-17 ENCOUNTER — Other Ambulatory Visit: Payer: Self-pay | Admitting: Physician Assistant

## 2015-01-17 DIAGNOSIS — T1490XA Injury, unspecified, initial encounter: Secondary | ICD-10-CM

## 2016-05-08 DIAGNOSIS — M25562 Pain in left knee: Secondary | ICD-10-CM | POA: Diagnosis not present

## 2016-05-27 DIAGNOSIS — B356 Tinea cruris: Secondary | ICD-10-CM | POA: Diagnosis not present

## 2016-05-27 DIAGNOSIS — Z0000 Encounter for general adult medical examination without abnormal findings: Secondary | ICD-10-CM | POA: Diagnosis not present

## 2016-11-08 ENCOUNTER — Emergency Department (HOSPITAL_COMMUNITY)
Admission: EM | Admit: 2016-11-08 | Discharge: 2016-11-08 | Disposition: A | Discharge status: Home / Self Care | Payer: No Typology Code available for payment source | Attending: Emergency Medicine | Admitting: Emergency Medicine

## 2016-11-08 ENCOUNTER — Encounter (HOSPITAL_COMMUNITY): Payer: Self-pay | Admitting: Nurse Practitioner

## 2016-11-08 ENCOUNTER — Emergency Department (HOSPITAL_COMMUNITY): Payer: No Typology Code available for payment source

## 2016-11-08 DIAGNOSIS — Y999 Unspecified external cause status: Secondary | ICD-10-CM | POA: Insufficient documentation

## 2016-11-08 DIAGNOSIS — S8992XA Unspecified injury of left lower leg, initial encounter: Secondary | ICD-10-CM | POA: Diagnosis not present

## 2016-11-08 DIAGNOSIS — S8002XA Contusion of left knee, initial encounter: Secondary | ICD-10-CM

## 2016-11-08 DIAGNOSIS — M25562 Pain in left knee: Secondary | ICD-10-CM | POA: Diagnosis not present

## 2016-11-08 DIAGNOSIS — Z79899 Other long term (current) drug therapy: Secondary | ICD-10-CM | POA: Insufficient documentation

## 2016-11-08 DIAGNOSIS — M62830 Muscle spasm of back: Secondary | ICD-10-CM | POA: Insufficient documentation

## 2016-11-08 DIAGNOSIS — Y9389 Activity, other specified: Secondary | ICD-10-CM | POA: Insufficient documentation

## 2016-11-08 DIAGNOSIS — Y92410 Unspecified street and highway as the place of occurrence of the external cause: Secondary | ICD-10-CM | POA: Diagnosis not present

## 2016-11-08 DIAGNOSIS — M549 Dorsalgia, unspecified: Secondary | ICD-10-CM | POA: Diagnosis not present

## 2016-11-08 MED ORDER — CYCLOBENZAPRINE HCL 10 MG PO TABS: 10.0000 mg | ORAL_TABLET | Freq: Two times a day (BID) | ORAL | 0 refills | Status: DC | PRN

## 2016-11-08 MED ORDER — DICLOFENAC SODIUM 50 MG PO TBEC: 50.0000 mg | DELAYED_RELEASE_TABLET | Freq: Two times a day (BID) | ORAL | 0 refills | Status: DC

## 2016-11-08 NOTE — Discharge Instructions (Signed)
Take the medication as directed. Do not take the muscle relaxant while driving as it will make you sleepy. Follow up with your doctor. Return here for worsening symptoms
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
## 2016-11-10 DIAGNOSIS — S46919A Strain of unspecified muscle, fascia and tendon at shoulder and upper arm level, unspecified arm, initial encounter: Secondary | ICD-10-CM | POA: Diagnosis not present

## 2016-11-10 DIAGNOSIS — S39012A Strain of muscle, fascia and tendon of lower back, initial encounter: Secondary | ICD-10-CM | POA: Diagnosis not present

## 2016-11-20 DIAGNOSIS — R946 Abnormal results of thyroid function studies: Secondary | ICD-10-CM | POA: Diagnosis not present

## 2016-11-20 DIAGNOSIS — M25519 Pain in unspecified shoulder: Secondary | ICD-10-CM | POA: Diagnosis not present

## 2016-11-20 DIAGNOSIS — M546 Pain in thoracic spine: Secondary | ICD-10-CM | POA: Diagnosis not present

## 2016-12-11 DIAGNOSIS — R946 Abnormal results of thyroid function studies: Secondary | ICD-10-CM | POA: Diagnosis not present

## 2017-02-10 DIAGNOSIS — R05 Cough: Secondary | ICD-10-CM | POA: Diagnosis not present

## 2017-02-10 DIAGNOSIS — J9801 Acute bronchospasm: Secondary | ICD-10-CM | POA: Diagnosis not present

## 2017-05-31 DIAGNOSIS — Z0000 Encounter for general adult medical examination without abnormal findings: Secondary | ICD-10-CM | POA: Diagnosis not present

## 2017-05-31 DIAGNOSIS — Z125 Encounter for screening for malignant neoplasm of prostate: Secondary | ICD-10-CM | POA: Diagnosis not present

## 2017-06-21 ENCOUNTER — Emergency Department (HOSPITAL_COMMUNITY)
Admission: EM | Admit: 2017-06-21 | Discharge: 2017-06-21 | Disposition: A | Discharge status: Home / Self Care | Payer: 59 | Attending: Emergency Medicine | Admitting: Emergency Medicine

## 2017-06-21 ENCOUNTER — Other Ambulatory Visit: Payer: Self-pay

## 2017-06-21 ENCOUNTER — Encounter (HOSPITAL_COMMUNITY): Payer: Self-pay

## 2017-06-21 DIAGNOSIS — R197 Diarrhea, unspecified: Secondary | ICD-10-CM | POA: Insufficient documentation

## 2017-06-21 DIAGNOSIS — R112 Nausea with vomiting, unspecified: Secondary | ICD-10-CM | POA: Diagnosis not present

## 2017-06-21 DIAGNOSIS — Z933 Colostomy status: Secondary | ICD-10-CM | POA: Diagnosis not present

## 2017-06-21 HISTORY — DX: Diverticulitis of intestine, part unspecified, without perforation or abscess without bleeding: K57.92

## 2017-06-21 LAB — CBC
HEMATOCRIT: 48.5 % (ref 39.0–52.0)
HEMOGLOBIN: 17.1 g/dL — AB (ref 13.0–17.0)
MCH: 32.7 pg (ref 26.0–34.0)
MCHC: 35.3 g/dL (ref 30.0–36.0)
MCV: 92.7 fL (ref 78.0–100.0)
Platelets: 250 10*3/uL (ref 150–400)
RBC: 5.23 MIL/uL (ref 4.22–5.81)
RDW: 13.9 % (ref 11.5–15.5)
WBC: 7 10*3/uL (ref 4.0–10.5)

## 2017-06-21 LAB — COMPREHENSIVE METABOLIC PANEL
ALBUMIN: 4.4 g/dL (ref 3.5–5.0)
ALT: 26 U/L (ref 17–63)
ANION GAP: 10 (ref 5–15)
AST: 26 U/L (ref 15–41)
Alkaline Phosphatase: 59 U/L (ref 38–126)
BUN: 21 mg/dL — ABNORMAL HIGH (ref 6–20)
CHLORIDE: 105 mmol/L (ref 101–111)
CO2: 27 mmol/L (ref 22–32)
Calcium: 9.3 mg/dL (ref 8.9–10.3)
Creatinine, Ser: 0.97 mg/dL (ref 0.61–1.24)
GFR calc Af Amer: >60 mL/min (ref >60)
GFR calc non Af Amer: >60 mL/min (ref >60)
Glucose, Bld: 104 mg/dL — ABNORMAL HIGH (ref 65–99)
POTASSIUM: 4 mmol/L (ref 3.5–5.1)
Sodium: 142 mmol/L (ref 135–145)
Total Bilirubin: 0.6 mg/dL (ref 0.3–1.2)
Total Protein: 8.4 g/dL — ABNORMAL HIGH (ref 6.5–8.1)

## 2017-06-21 LAB — LIPASE, BLOOD: LIPASE: 23 U/L (ref 11–51)

## 2017-06-21 MED ORDER — ONDANSETRON 4 MG PO TBDP
4.0000 mg | ORAL_TABLET | Freq: Once | ORAL | Status: AC | PRN
  Administered 2017-06-21: 4 mg via ORAL
  Filled 2017-06-21: qty 1

## 2017-06-21 MED ORDER — ONDANSETRON 4 MG PO TBDP: 4.0000 mg | ORAL_TABLET | Freq: Three times a day (TID) | ORAL | 0 refills | Status: AC | PRN

## 2017-06-21 NOTE — ED Provider Notes (Signed)
[facility]  CSN: [id number] Arrival date & time: 06/21/17 [id number]  Chief Complaint(s) Emesis and Diarrhea  HPI Michael Santiago is a 57 y.o. male   The history is provided by the patient.  Abdominal Cramping  This is a new problem. Episode onset: 4 days. Episode frequency: intermittent. The problem has been gradually improving. Pertinent negatives include no chest pain, no headaches and no shortness of breath. Nothing aggravates the symptoms. Nothing relieves the symptoms. He has tried nothing for the symptoms.   Reports suspicious food intake 4 days ago. Notes associated nausea, NBNB emesis (now resolved), and watery diarrhea.   No recent travel or Abx.    Past Medical History Past Medical History:  Diagnosis Date  . Digestive system complication   . Diverticulitis   . Hematoma   . Wound, open, abdominal wall, anterior    Patient Active Problem List   Diagnosis Date Noted  . Abdominal pain, unspecified site 04/19/2012  . Postop check 11/25/2010  . Open wound of abdominal wall without complication 10/21/2010  . Colostomy in place [facility]) 09/09/2010   Home Medication(s) Prior to Admission medications   Medication Sig Start Date End Date Taking? Authorizing Provider  acetaminophen (TYLENOL) 500 MG tablet Take 1,000 mg by mouth every 6 (six) hours as needed for mild pain or moderate pain.   Yes [provider]  cyclobenzaprine (FLEXERIL) 10 MG tablet Take 1 tablet (10 mg total) by mouth 2 (two) times daily as needed for muscle spasms. Patient not taking: Reported on 06/21/2017 11/08/16   Janne NapoleonNeese, Hope M, NP  diclofenac (VOLTAREN) 50 MG EC tablet Take 1 tablet (50 mg total) by mouth 2 (two) times daily. Patient not taking: Reported on 06/21/2017 11/08/16   Janne NapoleonNeese, Hope M, NP  ondansetron (ZOFRAN ODT) 4 MG disintegrating tablet Take 1 tablet (4 mg total) by mouth every 8 (eight) hours as needed for up to 3 days for nausea or  vomiting. 06/21/17 06/24/17  Nira Connardama, Hazelgrace Bonham Eduardo, MD                                                                                                                                    Past Surgical History Past Surgical History:  Procedure Laterality Date  . ABDOMINAL SURGERY    . KNEE SURGERY    . NASAL SINUS SURGERY    . TOE SURGERY     Family History Family History  Problem Relation Age of Onset  . Asthma Mother   . Heart disease Mother     Social History Social History   Tobacco Use  . Smoking status: Never Smoker  . Smokeless tobacco: Never Used  Substance Use Topics  . Alcohol use: Yes    Comment: BEER 2X A WEEK  . Drug use: Yes    Types: Marijuana    Comment: 3-4 times a week.   Allergies Pseudoephedrine-acetaminophen  Review of  Systems Review of Systems  Respiratory: Negative for shortness of breath.   Cardiovascular: Negative for chest pain.  Neurological: Negative for headaches.   All other systems are reviewed and are negative for acute change except as noted in the HPI  Physical Exam Vital Signs  I have reviewed the triage vital signs BP 122/78   Pulse 73   Temp 97.8 F (36.6 C) (Oral)   Resp 18   Ht 5\' 11"  (1.803 m)   Wt 74.8 kg (165 lb)   SpO2 100%   BMI 23.01 kg/m   Physical Exam  Constitutional: He is oriented to person, place, and time. He appears well-developed and well-nourished. No distress.  HENT:  Head: Normocephalic and atraumatic.  Right Ear: External ear normal.  Left Ear: External ear normal.  Nose: Nose normal.  Mouth/Throat: Mucous membranes are normal. No trismus in the jaw.  Eyes: Conjunctivae and EOM are normal. No scleral icterus.  Neck: Normal range of motion and phonation normal.  Cardiovascular: Normal rate and regular rhythm.  Pulmonary/Chest: Effort normal. No stridor. No respiratory distress.  Abdominal: He exhibits no distension. There is no tenderness. There is no rigidity, no rebound, no guarding and no CVA  tenderness.  Musculoskeletal: Normal range of motion. He exhibits no edema.  Neurological: He is alert and oriented to person, place, and time.  Skin: He is not diaphoretic.  Psychiatric: He has a normal mood and affect. His behavior is normal.  Vitals reviewed.   ED Results and Treatments Labs (all labs ordered are listed, but only abnormal results are displayed) Labs Reviewed  COMPREHENSIVE METABOLIC PANEL - Abnormal; Notable for the following components:      Result Value   Glucose, Bld 104 (*)    BUN 21 (*)    Total Protein 8.4 (*)    All other components within normal limits  CBC - Abnormal; Notable for the following components:   Hemoglobin 17.1 (*)    All other components within normal limits  LIPASE, BLOOD  URINALYSIS, ROUTINE W REFLEX MICROSCOPIC                                                                                                                         EKG  EKG Interpretation  Date/Time:    Ventricular Rate:    PR Interval:    QRS Duration:   QT Interval:    QTC Calculation:   R Axis:     Text Interpretation:        Radiology No results found. Pertinent labs & imaging results that were available during my care of the patient were reviewed by me and considered in my medical decision making (see chart for details).  Medications Ordered in ED Medications  ondansetron (ZOFRAN-ODT) disintegrating tablet 4 mg (4 mg Oral Given 06/21/17 0959)  Procedures Procedures  (including critical care time)  Medical Decision Making / ED Course I have reviewed the nursing notes for this encounter and the patient's prior records (if available in EHR or on provided paperwork).    57 y.o. male presents with vomiting, diarrhea, and fever for 4 days. Likely possible suspicious food intake.  Decreaded oral tolerance. Rest of history as  above.  Patient appears well, not in distress, and with no signs of toxicity or dehydration. Abdomen benign. Rest of the exam as above  Labs reassuring.   Most consistent with viral gastroenteritis vs food poisoning.   Doubt appendicitis, diverticulitis, severe colitis, dysentery.    Able to tolerate oral intake in the ED.  Discussed symptomatic treatment with the patient and they will follow closely with their PCP.   Final Clinical Impression(s) / ED Diagnoses Final diagnoses:  Nausea vomiting and diarrhea    Disposition: Discharge  Condition: Good  I have discussed the results, Dx and Tx plan with the patient who expressed understanding and agree(s) with the plan. Discharge instructions discussed at great length. The patient was given strict return precautions who verbalized understanding of the instructions. No further questions at time of discharge.    ED Discharge Orders        Ordered    ondansetron (ZOFRAN ODT) 4 MG disintegrating tablet  Every 8 hours PRN     06/21/17 1442       Follow Up: Blair Heys, MD [address] [place] [address] [phone]  Schedule an appointment as soon as possible for a visit  As needed     This chart was dictated using voice recognition software.  Despite best efforts to proofread,  errors can occur which can change the documentation meaning.   Nira Conn, MD 06/21/17 [phone]

## 2017-06-21 NOTE — ED Triage Notes (Signed)
Patient c/o N/V/D x 3 days. Patient reports that his abdomen is sore from vomiting. patient also reports diverticulitis history.

## 2018-04-07 DIAGNOSIS — Z125 Encounter for screening for malignant neoplasm of prostate: Secondary | ICD-10-CM | POA: Diagnosis not present

## 2018-04-07 DIAGNOSIS — M791 Myalgia, unspecified site: Secondary | ICD-10-CM | POA: Diagnosis not present

## 2018-04-07 DIAGNOSIS — R350 Frequency of micturition: Secondary | ICD-10-CM | POA: Diagnosis not present

## 2018-04-12 ENCOUNTER — Other Ambulatory Visit: Payer: Self-pay | Admitting: Family Medicine

## 2018-04-12 DIAGNOSIS — R3129 Other microscopic hematuria: Secondary | ICD-10-CM

## 2018-04-13 ENCOUNTER — Other Ambulatory Visit: Payer: Self-pay | Admitting: Family Medicine

## 2018-04-13 DIAGNOSIS — R3129 Other microscopic hematuria: Secondary | ICD-10-CM

## 2018-04-14 ENCOUNTER — Ambulatory Visit
Admission: RE | Admit: 2018-04-14 | Discharge: 2018-04-14 | Disposition: A | Discharge status: Home / Self Care | Payer: 59 | Source: Ambulatory Visit | Attending: Family Medicine | Admitting: Family Medicine

## 2018-04-14 DIAGNOSIS — R3129 Other microscopic hematuria: Secondary | ICD-10-CM

## 2019-02-28 DIAGNOSIS — R8271 Bacteriuria: Secondary | ICD-10-CM | POA: Diagnosis not present

## 2019-02-28 DIAGNOSIS — N400 Enlarged prostate without lower urinary tract symptoms: Secondary | ICD-10-CM | POA: Diagnosis not present

## 2019-02-28 DIAGNOSIS — R972 Elevated prostate specific antigen [PSA]: Secondary | ICD-10-CM | POA: Diagnosis not present

## 2019-02-28 DIAGNOSIS — N5201 Erectile dysfunction due to arterial insufficiency: Secondary | ICD-10-CM | POA: Diagnosis not present

## 2019-03-07 DIAGNOSIS — N400 Enlarged prostate without lower urinary tract symptoms: Secondary | ICD-10-CM | POA: Diagnosis not present

## 2019-05-22 DIAGNOSIS — R972 Elevated prostate specific antigen [PSA]: Secondary | ICD-10-CM | POA: Diagnosis not present

## 2019-05-29 DIAGNOSIS — N5201 Erectile dysfunction due to arterial insufficiency: Secondary | ICD-10-CM | POA: Diagnosis not present

## 2019-05-29 DIAGNOSIS — R8271 Bacteriuria: Secondary | ICD-10-CM | POA: Diagnosis not present

## 2019-05-29 DIAGNOSIS — R311 Benign essential microscopic hematuria: Secondary | ICD-10-CM | POA: Diagnosis not present

## 2019-05-29 DIAGNOSIS — R972 Elevated prostate specific antigen [PSA]: Secondary | ICD-10-CM | POA: Diagnosis not present

## 2019-11-01 DIAGNOSIS — Z13220 Encounter for screening for lipoid disorders: Secondary | ICD-10-CM | POA: Diagnosis not present

## 2019-11-01 DIAGNOSIS — Z125 Encounter for screening for malignant neoplasm of prostate: Secondary | ICD-10-CM | POA: Diagnosis not present

## 2019-11-01 DIAGNOSIS — R634 Abnormal weight loss: Secondary | ICD-10-CM | POA: Diagnosis not present

## 2019-11-01 DIAGNOSIS — Z0000 Encounter for general adult medical examination without abnormal findings: Secondary | ICD-10-CM | POA: Diagnosis not present

## 2019-11-01 DIAGNOSIS — L309 Dermatitis, unspecified: Secondary | ICD-10-CM | POA: Diagnosis not present

## 2019-11-01 DIAGNOSIS — H0520 Unspecified exophthalmos: Secondary | ICD-10-CM | POA: Diagnosis not present

## 2019-11-09 DIAGNOSIS — L308 Other specified dermatitis: Secondary | ICD-10-CM | POA: Diagnosis not present

## 2019-12-22 DIAGNOSIS — H539 Unspecified visual disturbance: Secondary | ICD-10-CM | POA: Diagnosis not present

## 2019-12-25 DIAGNOSIS — H40023 Open angle with borderline findings, high risk, bilateral: Secondary | ICD-10-CM | POA: Diagnosis not present

## 2019-12-25 DIAGNOSIS — H43811 Vitreous degeneration, right eye: Secondary | ICD-10-CM | POA: Diagnosis not present

## 2019-12-25 DIAGNOSIS — H2513 Age-related nuclear cataract, bilateral: Secondary | ICD-10-CM | POA: Diagnosis not present

## 2020-01-24 DIAGNOSIS — H40033 Anatomical narrow angle, bilateral: Secondary | ICD-10-CM | POA: Diagnosis not present

## 2020-01-24 DIAGNOSIS — H2513 Age-related nuclear cataract, bilateral: Secondary | ICD-10-CM | POA: Diagnosis not present

## 2020-01-24 DIAGNOSIS — H40023 Open angle with borderline findings, high risk, bilateral: Secondary | ICD-10-CM | POA: Diagnosis not present

## 2020-01-24 DIAGNOSIS — H43811 Vitreous degeneration, right eye: Secondary | ICD-10-CM | POA: Diagnosis not present

## 2020-01-30 DIAGNOSIS — H40033 Anatomical narrow angle, bilateral: Secondary | ICD-10-CM | POA: Diagnosis not present

## 2020-01-30 DIAGNOSIS — H43811 Vitreous degeneration, right eye: Secondary | ICD-10-CM | POA: Diagnosis not present

## 2020-01-30 DIAGNOSIS — H2513 Age-related nuclear cataract, bilateral: Secondary | ICD-10-CM | POA: Diagnosis not present

## 2020-01-30 DIAGNOSIS — H0520 Unspecified exophthalmos: Secondary | ICD-10-CM | POA: Diagnosis not present

## 2020-02-26 DIAGNOSIS — H2513 Age-related nuclear cataract, bilateral: Secondary | ICD-10-CM | POA: Diagnosis not present

## 2020-02-26 DIAGNOSIS — H43811 Vitreous degeneration, right eye: Secondary | ICD-10-CM | POA: Diagnosis not present

## 2020-02-26 DIAGNOSIS — H40033 Anatomical narrow angle, bilateral: Secondary | ICD-10-CM | POA: Diagnosis not present

## 2020-08-13 DIAGNOSIS — R8290 Unspecified abnormal findings in urine: Secondary | ICD-10-CM | POA: Diagnosis not present

## 2020-08-13 DIAGNOSIS — R634 Abnormal weight loss: Secondary | ICD-10-CM | POA: Diagnosis not present

## 2020-08-13 DIAGNOSIS — M5432 Sciatica, left side: Secondary | ICD-10-CM | POA: Diagnosis not present

## 2020-08-13 DIAGNOSIS — M79672 Pain in left foot: Secondary | ICD-10-CM | POA: Diagnosis not present

## 2020-08-13 DIAGNOSIS — M25551 Pain in right hip: Secondary | ICD-10-CM | POA: Diagnosis not present

## 2020-09-03 DIAGNOSIS — R8279 Other abnormal findings on microbiological examination of urine: Secondary | ICD-10-CM | POA: Diagnosis not present

## 2020-12-02 ENCOUNTER — Encounter: Payer: Self-pay | Admitting: Podiatry

## 2020-12-02 ENCOUNTER — Other Ambulatory Visit: Payer: Self-pay

## 2020-12-02 ENCOUNTER — Ambulatory Visit (INDEPENDENT_AMBULATORY_CARE_PROVIDER_SITE_OTHER): Payer: BC Managed Care – PPO | Admitting: Podiatry

## 2020-12-02 ENCOUNTER — Ambulatory Visit (INDEPENDENT_AMBULATORY_CARE_PROVIDER_SITE_OTHER): Payer: BC Managed Care – PPO

## 2020-12-02 DIAGNOSIS — M7750 Other enthesopathy of unspecified foot: Secondary | ICD-10-CM | POA: Diagnosis not present

## 2020-12-02 DIAGNOSIS — M7752 Other enthesopathy of left foot: Secondary | ICD-10-CM

## 2020-12-02 DIAGNOSIS — M67472 Ganglion, left ankle and foot: Secondary | ICD-10-CM | POA: Diagnosis not present

## 2020-12-02 MED ORDER — ETODOLAC 400 MG PO TABS: 400.0000 mg | ORAL_TABLET | Freq: Two times a day (BID) | ORAL | 3 refills | Status: AC | PRN

## 2020-12-02 NOTE — Progress Notes (Signed)
  Subjective:  Patient ID: Michael Santiago, male    DOB: [birth year],  MRN: [id number]  Chief Complaint  Patient presents with   Foot Pain    NP  left foot pain    60 y.o. male presents with the above complaint. History confirmed with patient.  He notes a small knot on the front of the ankle near the top of the foot.  Its been painful with activity.  Has been active and bothers him when he walks.  Works as a Advice worker job  Objective:  Physical Exam: warm, good capillary refill, no trophic changes or ulcerative lesions, normal DP and PT pulses, and normal sensory exam. Left Foot: Small palpable fluctuant cystic mass along the extensor digitorum longus tendon at the extensor retinaculum  Radiographs: Multiple views x-ray of the left foot: No radiographic abnormalities in the area of concern where the mass is located Assessment:   1. Ganglion cyst of left foot      Plan:  Patient was evaluated and treated and all questions answered.  Discussed with him I think is likely a ganglion cyst originating from the EDL tendon.  We discussed treatment for ganglion cyst including drainage and possible surgical excision.  Currently its only intermittently bothersome.  I discussed imaging with an MRI and he will hold off on this for now and try a change in shoe gear to see if this improves his symptoms.  He will let me know if this is not helpful and if not we will order MRI for surgical planning for excision.  I think the area it is in it will be difficult to drain safely here in the office and surgical excision would likely be a better option.  He will return as needed  No follow-ups on file.

## 2020-12-11 DIAGNOSIS — Z23 Encounter for immunization: Secondary | ICD-10-CM | POA: Diagnosis not present

## 2020-12-11 DIAGNOSIS — Z0000 Encounter for general adult medical examination without abnormal findings: Secondary | ICD-10-CM | POA: Diagnosis not present

## 2020-12-11 DIAGNOSIS — E785 Hyperlipidemia, unspecified: Secondary | ICD-10-CM | POA: Diagnosis not present

## 2020-12-11 DIAGNOSIS — Z131 Encounter for screening for diabetes mellitus: Secondary | ICD-10-CM | POA: Diagnosis not present

## 2020-12-11 DIAGNOSIS — Z125 Encounter for screening for malignant neoplasm of prostate: Secondary | ICD-10-CM | POA: Diagnosis not present

## 2020-12-13 DIAGNOSIS — Z1211 Encounter for screening for malignant neoplasm of colon: Secondary | ICD-10-CM | POA: Diagnosis not present

## 2021-03-12 DIAGNOSIS — H40013 Open angle with borderline findings, low risk, bilateral: Secondary | ICD-10-CM | POA: Diagnosis not present

## 2021-03-28 DIAGNOSIS — H11002 Unspecified pterygium of left eye: Secondary | ICD-10-CM | POA: Diagnosis not present

## 2021-04-09 DIAGNOSIS — H524 Presbyopia: Secondary | ICD-10-CM | POA: Diagnosis not present

## 2021-04-09 DIAGNOSIS — H40013 Open angle with borderline findings, low risk, bilateral: Secondary | ICD-10-CM | POA: Diagnosis not present

## 2021-07-25 DIAGNOSIS — L301 Dyshidrosis [pompholyx]: Secondary | ICD-10-CM | POA: Diagnosis not present

## 2021-07-25 DIAGNOSIS — L308 Other specified dermatitis: Secondary | ICD-10-CM | POA: Diagnosis not present

## 2021-11-29 DIAGNOSIS — R102 Pelvic and perineal pain: Secondary | ICD-10-CM | POA: Diagnosis not present

## 2021-11-29 DIAGNOSIS — M25511 Pain in right shoulder: Secondary | ICD-10-CM | POA: Diagnosis not present

## 2021-11-29 DIAGNOSIS — S46911A Strain of unspecified muscle, fascia and tendon at shoulder and upper arm level, right arm, initial encounter: Secondary | ICD-10-CM | POA: Diagnosis not present

## 2021-11-29 DIAGNOSIS — S7002XA Contusion of left hip, initial encounter: Secondary | ICD-10-CM | POA: Diagnosis not present

## 2021-11-29 DIAGNOSIS — S0003XA Contusion of scalp, initial encounter: Secondary | ICD-10-CM | POA: Diagnosis not present

## 2021-11-29 DIAGNOSIS — T1490XA Injury, unspecified, initial encounter: Secondary | ICD-10-CM | POA: Diagnosis not present

## 2021-11-29 DIAGNOSIS — S39012A Strain of muscle, fascia and tendon of lower back, initial encounter: Secondary | ICD-10-CM | POA: Diagnosis not present

## 2021-11-29 DIAGNOSIS — N289 Disorder of kidney and ureter, unspecified: Secondary | ICD-10-CM | POA: Diagnosis not present

## 2021-11-29 DIAGNOSIS — S43401A Unspecified sprain of right shoulder joint, initial encounter: Secondary | ICD-10-CM | POA: Diagnosis not present

## 2021-11-29 DIAGNOSIS — M542 Cervicalgia: Secondary | ICD-10-CM | POA: Diagnosis not present

## 2021-11-29 DIAGNOSIS — Z041 Encounter for examination and observation following transport accident: Secondary | ICD-10-CM | POA: Diagnosis not present

## 2021-11-29 DIAGNOSIS — S161XXA Strain of muscle, fascia and tendon at neck level, initial encounter: Secondary | ICD-10-CM | POA: Diagnosis not present

## 2021-11-29 DIAGNOSIS — M25552 Pain in left hip: Secondary | ICD-10-CM | POA: Diagnosis not present

## 2021-12-01 DIAGNOSIS — M542 Cervicalgia: Secondary | ICD-10-CM | POA: Diagnosis not present

## 2021-12-01 DIAGNOSIS — M545 Low back pain, unspecified: Secondary | ICD-10-CM | POA: Diagnosis not present

## 2021-12-01 DIAGNOSIS — M25511 Pain in right shoulder: Secondary | ICD-10-CM | POA: Diagnosis not present

## 2021-12-03 DIAGNOSIS — S199XXD Unspecified injury of neck, subsequent encounter: Secondary | ICD-10-CM | POA: Diagnosis not present

## 2021-12-03 DIAGNOSIS — M545 Low back pain, unspecified: Secondary | ICD-10-CM | POA: Diagnosis not present

## 2021-12-03 DIAGNOSIS — M25511 Pain in right shoulder: Secondary | ICD-10-CM | POA: Diagnosis not present

## 2021-12-03 DIAGNOSIS — M542 Cervicalgia: Secondary | ICD-10-CM | POA: Diagnosis not present

## 2021-12-04 ENCOUNTER — Other Ambulatory Visit: Payer: Self-pay | Admitting: Family Medicine

## 2021-12-04 DIAGNOSIS — Q6100 Congenital renal cyst, unspecified: Secondary | ICD-10-CM

## 2021-12-07 DIAGNOSIS — M67911 Unspecified disorder of synovium and tendon, right shoulder: Secondary | ICD-10-CM | POA: Diagnosis not present

## 2021-12-07 DIAGNOSIS — M5416 Radiculopathy, lumbar region: Secondary | ICD-10-CM | POA: Diagnosis not present

## 2021-12-07 DIAGNOSIS — M542 Cervicalgia: Secondary | ICD-10-CM | POA: Diagnosis not present

## 2021-12-09 DIAGNOSIS — M25552 Pain in left hip: Secondary | ICD-10-CM | POA: Diagnosis not present

## 2021-12-09 DIAGNOSIS — M545 Low back pain, unspecified: Secondary | ICD-10-CM | POA: Diagnosis not present

## 2021-12-09 DIAGNOSIS — M542 Cervicalgia: Secondary | ICD-10-CM | POA: Diagnosis not present

## 2021-12-09 DIAGNOSIS — S4361XA Sprain of right sternoclavicular joint, initial encounter: Secondary | ICD-10-CM | POA: Diagnosis not present

## 2021-12-10 ENCOUNTER — Other Ambulatory Visit: Payer: Self-pay | Admitting: Orthopedic Surgery

## 2021-12-10 DIAGNOSIS — S4361XA Sprain of right sternoclavicular joint, initial encounter: Secondary | ICD-10-CM

## 2021-12-21 ENCOUNTER — Ambulatory Visit
Admission: RE | Admit: 2021-12-21 | Discharge: 2021-12-21 | Disposition: A | Discharge status: Home / Self Care | Payer: BC Managed Care – PPO | Source: Ambulatory Visit | Attending: Family Medicine | Admitting: Family Medicine

## 2021-12-21 DIAGNOSIS — N2889 Other specified disorders of kidney and ureter: Secondary | ICD-10-CM | POA: Diagnosis not present

## 2021-12-21 DIAGNOSIS — Q6100 Congenital renal cyst, unspecified: Secondary | ICD-10-CM

## 2021-12-21 MED ORDER — GADOPICLENOL 0.5 MMOL/ML IV SOLN
7.0000 mL | Freq: Once | INTRAVENOUS | Status: AC | PRN
  Administered 2021-12-21: 7 mL via INTRAVENOUS

## 2022-01-04 DIAGNOSIS — M542 Cervicalgia: Secondary | ICD-10-CM | POA: Diagnosis not present

## 2022-01-06 ENCOUNTER — Ambulatory Visit
Admission: RE | Admit: 2022-01-06 | Discharge: 2022-01-06 | Disposition: A | Discharge status: Home / Self Care | Payer: BC Managed Care – PPO | Source: Ambulatory Visit | Attending: Orthopedic Surgery | Admitting: Orthopedic Surgery

## 2022-01-06 DIAGNOSIS — S4361XA Sprain of right sternoclavicular joint, initial encounter: Secondary | ICD-10-CM

## 2022-01-06 DIAGNOSIS — I700 Atherosclerosis of aorta: Secondary | ICD-10-CM | POA: Diagnosis not present

## 2022-01-06 DIAGNOSIS — R911 Solitary pulmonary nodule: Secondary | ICD-10-CM | POA: Diagnosis not present

## 2022-01-06 DIAGNOSIS — R918 Other nonspecific abnormal finding of lung field: Secondary | ICD-10-CM | POA: Diagnosis not present

## 2022-01-09 DIAGNOSIS — M542 Cervicalgia: Secondary | ICD-10-CM | POA: Diagnosis not present

## 2022-01-13 DIAGNOSIS — E785 Hyperlipidemia, unspecified: Secondary | ICD-10-CM | POA: Diagnosis not present

## 2022-01-13 DIAGNOSIS — Z125 Encounter for screening for malignant neoplasm of prostate: Secondary | ICD-10-CM | POA: Diagnosis not present

## 2022-01-13 DIAGNOSIS — Z131 Encounter for screening for diabetes mellitus: Secondary | ICD-10-CM | POA: Diagnosis not present

## 2022-01-13 DIAGNOSIS — Z0000 Encounter for general adult medical examination without abnormal findings: Secondary | ICD-10-CM | POA: Diagnosis not present

## 2022-01-20 DIAGNOSIS — Z1211 Encounter for screening for malignant neoplasm of colon: Secondary | ICD-10-CM | POA: Diagnosis not present

## 2022-01-23 DIAGNOSIS — L301 Dyshidrosis [pompholyx]: Secondary | ICD-10-CM | POA: Diagnosis not present

## 2023-01-20 DIAGNOSIS — Z125 Encounter for screening for malignant neoplasm of prostate: Secondary | ICD-10-CM | POA: Diagnosis not present

## 2023-01-20 DIAGNOSIS — E785 Hyperlipidemia, unspecified: Secondary | ICD-10-CM | POA: Diagnosis not present

## 2023-01-21 DIAGNOSIS — Z1211 Encounter for screening for malignant neoplasm of colon: Secondary | ICD-10-CM | POA: Diagnosis not present

## 2023-01-21 DIAGNOSIS — Z0000 Encounter for general adult medical examination without abnormal findings: Secondary | ICD-10-CM | POA: Diagnosis not present

## 2023-01-21 DIAGNOSIS — E785 Hyperlipidemia, unspecified: Secondary | ICD-10-CM | POA: Diagnosis not present

## 2023-01-21 DIAGNOSIS — Z125 Encounter for screening for malignant neoplasm of prostate: Secondary | ICD-10-CM | POA: Diagnosis not present

## 2023-01-21 DIAGNOSIS — Z113 Encounter for screening for infections with a predominantly sexual mode of transmission: Secondary | ICD-10-CM | POA: Diagnosis not present

## 2023-05-13 DIAGNOSIS — J988 Other specified respiratory disorders: Secondary | ICD-10-CM | POA: Diagnosis not present

## 2023-11-18 DIAGNOSIS — Z043 Encounter for examination and observation following other accident: Secondary | ICD-10-CM | POA: Diagnosis not present

## 2023-11-18 DIAGNOSIS — M5441 Lumbago with sciatica, right side: Secondary | ICD-10-CM | POA: Diagnosis not present

## 2023-11-18 DIAGNOSIS — W010XXA Fall on same level from slipping, tripping and stumbling without subsequent striking against object, initial encounter: Secondary | ICD-10-CM | POA: Diagnosis not present

## 2023-11-18 DIAGNOSIS — M545 Low back pain, unspecified: Secondary | ICD-10-CM | POA: Diagnosis not present

## 2023-11-18 DIAGNOSIS — M5416 Radiculopathy, lumbar region: Secondary | ICD-10-CM | POA: Diagnosis not present

## 2023-11-22 DIAGNOSIS — M5442 Lumbago with sciatica, left side: Secondary | ICD-10-CM | POA: Diagnosis not present

## 2023-11-23 DIAGNOSIS — M545 Low back pain, unspecified: Secondary | ICD-10-CM | POA: Diagnosis not present

## 2023-12-01 DIAGNOSIS — H40013 Open angle with borderline findings, low risk, bilateral: Secondary | ICD-10-CM | POA: Diagnosis not present

## 2023-12-03 DIAGNOSIS — M5416 Radiculopathy, lumbar region: Secondary | ICD-10-CM | POA: Diagnosis not present

## 2023-12-17 DIAGNOSIS — M5416 Radiculopathy, lumbar region: Secondary | ICD-10-CM | POA: Diagnosis not present

## 2023-12-21 DIAGNOSIS — G8929 Other chronic pain: Secondary | ICD-10-CM | POA: Diagnosis not present

## 2023-12-21 DIAGNOSIS — M5459 Other low back pain: Secondary | ICD-10-CM | POA: Diagnosis not present

## 2024-01-26 DIAGNOSIS — Z0189 Encounter for other specified special examinations: Secondary | ICD-10-CM | POA: Diagnosis not present

## 2024-01-26 DIAGNOSIS — Z113 Encounter for screening for infections with a predominantly sexual mode of transmission: Secondary | ICD-10-CM | POA: Diagnosis not present

## 2024-01-26 DIAGNOSIS — E785 Hyperlipidemia, unspecified: Secondary | ICD-10-CM | POA: Diagnosis not present

## 2024-01-26 DIAGNOSIS — L409 Psoriasis, unspecified: Secondary | ICD-10-CM | POA: Diagnosis not present

## 2024-01-26 DIAGNOSIS — Z125 Encounter for screening for malignant neoplasm of prostate: Secondary | ICD-10-CM | POA: Diagnosis not present

## 2024-01-26 DIAGNOSIS — K219 Gastro-esophageal reflux disease without esophagitis: Secondary | ICD-10-CM | POA: Diagnosis not present

## 2024-01-26 DIAGNOSIS — R195 Other fecal abnormalities: Secondary | ICD-10-CM | POA: Diagnosis not present

## 2024-01-27 ENCOUNTER — Other Ambulatory Visit (HOSPITAL_BASED_OUTPATIENT_CLINIC_OR_DEPARTMENT_OTHER): Payer: Self-pay | Admitting: Family Medicine

## 2024-01-27 DIAGNOSIS — E785 Hyperlipidemia, unspecified: Secondary | ICD-10-CM

## 2024-02-14 ENCOUNTER — Ambulatory Visit (HOSPITAL_BASED_OUTPATIENT_CLINIC_OR_DEPARTMENT_OTHER)
Admission: RE | Admit: 2024-02-14 | Discharge: 2024-02-14 | Disposition: A | Payer: Self-pay | Source: Ambulatory Visit | Attending: Family Medicine | Admitting: Family Medicine

## 2024-02-14 DIAGNOSIS — E785 Hyperlipidemia, unspecified: Secondary | ICD-10-CM
